# Patient Record
Sex: Female | Born: 1990 | Race: White | Hispanic: No | Marital: Single | Smoking: Never smoker
Health system: Southern US, Community
[De-identification: ages and names within clinical notes are randomized; demographics above are authoritative.]

## PROBLEM LIST (undated history)

## (undated) DIAGNOSIS — F419 Anxiety disorder, unspecified: Secondary | ICD-10-CM

## (undated) DIAGNOSIS — T50912A Poisoning by multiple unspecified drugs, medicaments and biological substances, intentional self-harm, initial encounter: Secondary | ICD-10-CM

## (undated) DIAGNOSIS — F32A Depression, unspecified: Secondary | ICD-10-CM

## (undated) DIAGNOSIS — F329 Major depressive disorder, single episode, unspecified: Secondary | ICD-10-CM

## (undated) DIAGNOSIS — F319 Bipolar disorder, unspecified: Secondary | ICD-10-CM

## (undated) HISTORY — DX: Depression, unspecified: F32.A

## (undated) HISTORY — DX: Anxiety disorder, unspecified: F41.9

## (undated) HISTORY — PX: NO PAST SURGERIES: SHX2092

## (undated) HISTORY — DX: Poisoning by multiple unspecified drugs, medicaments and biological substances, intentional self-harm, initial encounter: T50.912A

## (undated) HISTORY — DX: Major depressive disorder, single episode, unspecified: F32.9

---

## 2012-06-11 ENCOUNTER — Emergency Department (HOSPITAL_COMMUNITY): Payer: BC Managed Care – PPO

## 2012-06-11 ENCOUNTER — Emergency Department (HOSPITAL_COMMUNITY)
Admission: EM | Admit: 2012-06-11 | Discharge: 2012-06-11 | Disposition: A | Discharge status: Home / Self Care | Payer: BC Managed Care – PPO | Attending: Emergency Medicine | Admitting: Emergency Medicine

## 2012-06-11 ENCOUNTER — Encounter (HOSPITAL_COMMUNITY): Payer: Self-pay | Admitting: *Deleted

## 2012-06-11 DIAGNOSIS — R21 Rash and other nonspecific skin eruption: Secondary | ICD-10-CM | POA: Insufficient documentation

## 2012-06-11 DIAGNOSIS — L02419 Cutaneous abscess of limb, unspecified: Secondary | ICD-10-CM | POA: Insufficient documentation

## 2012-06-11 DIAGNOSIS — Z8659 Personal history of other mental and behavioral disorders: Secondary | ICD-10-CM | POA: Insufficient documentation

## 2012-06-11 DIAGNOSIS — W57XXXA Bitten or stung by nonvenomous insect and other nonvenomous arthropods, initial encounter: Secondary | ICD-10-CM

## 2012-06-11 DIAGNOSIS — Y9289 Other specified places as the place of occurrence of the external cause: Secondary | ICD-10-CM | POA: Insufficient documentation

## 2012-06-11 DIAGNOSIS — L03116 Cellulitis of left lower limb: Secondary | ICD-10-CM

## 2012-06-11 DIAGNOSIS — R109 Unspecified abdominal pain: Secondary | ICD-10-CM | POA: Insufficient documentation

## 2012-06-11 DIAGNOSIS — M545 Low back pain, unspecified: Secondary | ICD-10-CM | POA: Insufficient documentation

## 2012-06-11 DIAGNOSIS — L03119 Cellulitis of unspecified part of limb: Secondary | ICD-10-CM | POA: Insufficient documentation

## 2012-06-11 DIAGNOSIS — W208XXA Other cause of strike by thrown, projected or falling object, initial encounter: Secondary | ICD-10-CM | POA: Insufficient documentation

## 2012-06-11 DIAGNOSIS — Y990 Civilian activity done for income or pay: Secondary | ICD-10-CM | POA: Insufficient documentation

## 2012-06-11 HISTORY — DX: Bipolar disorder, unspecified: F31.9

## 2012-06-11 LAB — URINALYSIS, ROUTINE W REFLEX MICROSCOPIC
Hgb urine dipstick: NEGATIVE
Protein, ur: NEGATIVE mg/dL
Urobilinogen, UA: 0.2 mg/dL (ref 0.0–1.0)

## 2012-06-11 LAB — PREGNANCY, URINE: Preg Test, Ur: NEGATIVE

## 2012-06-11 MED ORDER — HYDROCORTISONE 1 % EX CREA: TOPICAL_CREAM | CUTANEOUS | Status: DC

## 2012-06-11 MED ORDER — CEPHALEXIN 500 MG PO CAPS: 500.0000 mg | ORAL_CAPSULE | Freq: Four times a day (QID) | ORAL | Status: DC

## 2012-06-11 MED ORDER — IBUPROFEN 600 MG PO TABS: 600.0000 mg | ORAL_TABLET | Freq: Four times a day (QID) | ORAL | Status: DC | PRN

## 2012-06-11 NOTE — ED Notes (Signed)
Pt from home with reports of left ankle pain and swelling after dropping a cutting board on ankle while at work a month ago. Pt also reports red, "hard", painful areas to both legs that she noticed 2-3 days ago. Pt endorses hx of same with stress but reports that they went away on their own.

## 2012-06-11 NOTE — ED Provider Notes (Signed)
History     CSN: [id number]  Arrival date & time 06/11/12  1191   First MD Initiated Contact with Patient 06/11/12 1201      Chief Complaint  Patient presents with  . Ankle Pain    left  . Joint Swelling    left  . Rash    both legs    (Consider location/radiation/quality/duration/timing/severity/associated sxs/prior treatment) HPI Jamey Demchak is a 22 y.o. female who presents to ED with multiple complaints. Pt states she dropped a cutting board on her left ankle a month ago. States got a "goose egg" in it but now ankle is still swollen, painful to walk on, skin tender, there is a red area on it. Pt also reports big red bumps all over her body, onset few days ago. States they are tender, not itchy. Has not tried any treatment. Also states her right flank pain, states pain on and off for about a month. Nothing makes it better or worse. She lifts heavy things at work. No urinary symptoms. No n/v/d. No abdominal pain. No respiratory symptoms.    Past Medical History  Diagnosis Date  . Bipolar 1 disorder     History reviewed. No pertinent past surgical history.  History reviewed. No pertinent family history.  History  Substance Use Topics  . Smoking status: Never Smoker   . Smokeless tobacco: Never Used  . Alcohol Use: Yes     Comment: occ    OB History    Grav Para Term Preterm Abortions TAB SAB Ect Mult Living                  Review of Systems  Constitutional: Negative for fever and chills.  Respiratory: Negative.   Cardiovascular: Negative.   Genitourinary: Positive for flank pain.  Musculoskeletal: Positive for joint swelling and arthralgias.  Skin: Positive for color change, rash and wound.  All other systems reviewed and are negative.    Allergies  Other  Home Medications   Current Outpatient Rx  Name  Route  Sig  Dispense  Refill  . CEPHALEXIN 500 MG PO CAPS   Oral   Take 1 capsule (500 mg total) by mouth 4 (four) times daily.   28 capsule   0   . HYDROCORTISONE 1 % EX CREA      Apply to affected area 2 times daily   15 g   0   . IBUPROFEN 600 MG PO TABS   Oral   Take 1 tablet (600 mg total) by mouth every 6 (six) hours as needed for pain.   30 tablet   0     BP 110/77  LMP 01/10/2012  Physical Exam  Nursing note and vitals reviewed. Constitutional: She appears well-developed and well-nourished. No distress.  Eyes: Conjunctivae normal are normal.  Neck: Neck supple.  Cardiovascular: Normal rate, regular rhythm and normal heart sounds.   Pulmonary/Chest: Effort normal and breath sounds normal. No respiratory distress. She has no wheezes. She has no rales.  Abdominal: Soft. Bowel sounds are normal. There is no tenderness.       No CVA tenderness  Musculoskeletal:       Left lower anterior shin and ankle swelling. Non pitting. There is an erythematous tender area about 4x2 cm, tender to palpation, where pt states "goose egg" was. No drainage. Area is indurated. No fluctuance. Full rom of the ankle. Normal foot. Normal knee. No calf tenderness. Lumbar spine non tender. Tender in the right lower paravertebral area.  No pain with bilateral straight leg raise  Neurological: She is alert.  Skin: Skin is warm and dry.       There are multiple erythematous raised papules over bilateral LE, thighs. Areas are indurated with central scab. tender  Psychiatric: She has a normal mood and affect. Her behavior is normal.    ED Course  Procedures (including critical care time)   Labs Reviewed  URINALYSIS, ROUTINE W REFLEX MICROSCOPIC  PREGNANCY, URINE   Dg Ankle Complete Left  06/11/2012  *RADIOLOGY REPORT*  Clinical Data: Pain and swelling  LEFT ANKLE COMPLETE - 3+ VIEW  Comparison: None.  Findings: Frontal, oblique, and lateral views were obtained.  There is marked swelling medially.  No fracture or effusion.  Ankle mortise appears intact.  IMPRESSION: Soft tissue swelling medially.  No fracture.  Mortise intact.   Original  Report Authenticated By: Bretta Bang, M.D.      1. Cellulitis of left ankle   2. Bug bites   3. Right low back pain       MDM  Pt with multiple complaints.  1. Left ankle pain/swelling: x-ray negative. Given erythema and tenderness, possible cellulitis vs hematoma infection to the leg. ROM of the joints normal, doubt septic joint. No calf tenderness, doubt DVT. WIll start on keflex. Instructed to get rechecked in 2-3 days.   2. Rash: most consistent with insect bites, possible bed bugs. Pt states rash started 2-3 days ago, mattress bought right prior to that. Instructed to wash all clothes, change mattress, hydrocortisone cream  3. Flank pain. UA negative. Pt does not appear to be in pain, doubt kidney stone. No URi symptoms, no tachycardia, no tachypnea, doubt PE or pneumonia. Suspect musculoskeletal. Pt does lift heavy objects at work.   Pt stable for d/chome with outpatient follow up.        Lottie Mussel, PA 06/11/12 1704

## 2012-06-12 NOTE — ED Provider Notes (Signed)
Medical screening examination/treatment/procedure(s) were performed by non-physician practitioner and as supervising physician I was immediately available for consultation/collaboration.  Juliet Rude. Rubin Payor, MD 06/12/12 [phone]

## 2014-03-09 ENCOUNTER — Emergency Department (HOSPITAL_COMMUNITY): Payer: BC Managed Care – PPO

## 2014-03-09 ENCOUNTER — Emergency Department (HOSPITAL_COMMUNITY)
Admission: EM | Admit: 2014-03-09 | Discharge: 2014-03-10 | Disposition: A | Discharge status: Home / Self Care | Payer: BC Managed Care – PPO | Attending: Emergency Medicine | Admitting: Emergency Medicine

## 2014-03-09 ENCOUNTER — Encounter (HOSPITAL_COMMUNITY): Payer: Self-pay | Admitting: Emergency Medicine

## 2014-03-09 DIAGNOSIS — Y9389 Activity, other specified: Secondary | ICD-10-CM | POA: Insufficient documentation

## 2014-03-09 DIAGNOSIS — F1210 Cannabis abuse, uncomplicated: Secondary | ICD-10-CM | POA: Diagnosis not present

## 2014-03-09 DIAGNOSIS — S0990XA Unspecified injury of head, initial encounter: Secondary | ICD-10-CM | POA: Diagnosis not present

## 2014-03-09 DIAGNOSIS — F410 Panic disorder [episodic paroxysmal anxiety] without agoraphobia: Secondary | ICD-10-CM | POA: Insufficient documentation

## 2014-03-09 DIAGNOSIS — Z3202 Encounter for pregnancy test, result negative: Secondary | ICD-10-CM | POA: Diagnosis not present

## 2014-03-09 DIAGNOSIS — Y92410 Unspecified street and highway as the place of occurrence of the external cause: Secondary | ICD-10-CM | POA: Diagnosis not present

## 2014-03-09 LAB — URINALYSIS, ROUTINE W REFLEX MICROSCOPIC
BILIRUBIN URINE: NEGATIVE
Glucose, UA: NEGATIVE mg/dL
HGB URINE DIPSTICK: NEGATIVE
KETONES UR: NEGATIVE mg/dL
Leukocytes, UA: NEGATIVE
Nitrite: NEGATIVE
Protein, ur: NEGATIVE mg/dL
SPECIFIC GRAVITY, URINE: 1.009 (ref 1.005–1.030)
UROBILINOGEN UA: 0.2 mg/dL (ref 0.0–1.0)
pH: 7 (ref 5.0–8.0)

## 2014-03-09 LAB — POC URINE PREG, ED: PREG TEST UR: NEGATIVE

## 2014-03-09 LAB — RAPID URINE DRUG SCREEN, HOSP PERFORMED
Amphetamines: NOT DETECTED
BARBITURATES: NOT DETECTED
Benzodiazepines: NOT DETECTED
COCAINE: NOT DETECTED
Opiates: NOT DETECTED
TETRAHYDROCANNABINOL: POSITIVE — AB

## 2014-03-09 LAB — COMPREHENSIVE METABOLIC PANEL WITH GFR
ALT: 9 U/L (ref 0–35)
AST: 15 U/L (ref 0–37)
Albumin: 4.5 g/dL (ref 3.5–5.2)
Alkaline Phosphatase: 79 U/L (ref 39–117)
Anion gap: 17 — ABNORMAL HIGH (ref 5–15)
BUN: 12 mg/dL (ref 6–23)
CO2: 19 meq/L (ref 19–32)
Calcium: 9.5 mg/dL (ref 8.4–10.5)
Chloride: 105 meq/L (ref 96–112)
Creatinine, Ser: 0.6 mg/dL (ref 0.50–1.10)
GFR calc Af Amer: >90 mL/min (ref >90)
GFR calc non Af Amer: >90 mL/min (ref >90)
Glucose, Bld: 87 mg/dL (ref 70–99)
Potassium: 3.6 meq/L — ABNORMAL LOW (ref 3.7–5.3)
Sodium: 141 meq/L (ref 137–147)
Total Bilirubin: 0.4 mg/dL (ref 0.3–1.2)
Total Protein: 7.7 g/dL (ref 6.0–8.3)

## 2014-03-09 LAB — CBC WITH DIFFERENTIAL/PLATELET
Basophils Absolute: 0 K/uL (ref 0.0–0.1)
Basophils Relative: 0 % (ref 0–1)
Eosinophils Absolute: 0.1 K/uL (ref 0.0–0.7)
Eosinophils Relative: 1 % (ref 0–5)
HCT: 38.7 % (ref 36.0–46.0)
Hemoglobin: 13.8 g/dL (ref 12.0–15.0)
Lymphocytes Relative: 15 % (ref 12–46)
Lymphs Abs: 1.3 K/uL (ref 0.7–4.0)
MCH: 31.7 pg (ref 26.0–34.0)
MCHC: 35.7 g/dL (ref 30.0–36.0)
MCV: 89 fL (ref 78.0–100.0)
Monocytes Absolute: 0.6 K/uL (ref 0.1–1.0)
Monocytes Relative: 7 % (ref 3–12)
Neutro Abs: 6.4 K/uL (ref 1.7–7.7)
Neutrophils Relative %: 77 % (ref 43–77)
Platelets: 229 K/uL (ref 150–400)
RBC: 4.35 MIL/uL (ref 3.87–5.11)
RDW: 11.5 % (ref 11.5–15.5)
WBC: 8.3 K/uL (ref 4.0–10.5)

## 2014-03-09 MED ORDER — SODIUM CHLORIDE 0.9 % IV SOLN
Freq: Once | INTRAVENOUS | Status: AC
  Administered 2014-03-09: 21:00:00 via INTRAVENOUS

## 2014-03-09 MED ORDER — LORAZEPAM 2 MG/ML IJ SOLN
1.0000 mg | Freq: Once | INTRAMUSCULAR | Status: AC
  Administered 2014-03-09: 1 mg via INTRAVENOUS
  Filled 2014-03-09: qty 1

## 2014-03-09 NOTE — ED Notes (Signed)
Unable to capture ekg, pt unable to hold still due to hyperventilating.
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
## 2014-03-10 ENCOUNTER — Telehealth (HOSPITAL_BASED_OUTPATIENT_CLINIC_OR_DEPARTMENT_OTHER): Payer: Self-pay | Admitting: Emergency Medicine

## 2014-03-10 MED ORDER — ALPRAZOLAM 0.5 MG PO TABS: 0.5000 mg | ORAL_TABLET | Freq: Three times a day (TID) | ORAL | Status: DC | PRN
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
## 2014-03-10 NOTE — Discharge Instructions (Signed)
Head Injury You have a head injury. Headaches and throwing up (vomiting) are common after a head injury. It should be easy to wake up from sleeping. Sometimes you must stay in the hospital. Most problems happen within the first 24 hours. Side effects may occur up to 7-10 days after the injury.  WHAT ARE THE TYPES OF HEAD INJURIES? Head injuries can be as minor as a bump. Some head injuries can be more severe. More severe head injuries include:  A jarring injury to the brain (concussion).  A bruise of the brain (contusion). This mean there is bleeding in the brain that can cause swelling.  A cracked skull (skull fracture).  Bleeding in the brain that collects, clots, and forms a bump (hematoma). WHEN SHOULD I GET HELP RIGHT AWAY?   You are confused or sleepy.  You cannot be woken up.  You feel sick to your stomach (nauseous) or keep throwing up (vomiting).  Your dizziness or unsteadiness is getting worse.  You have very bad, lasting headaches that are not helped by medicine. Take medicines only as told by your doctor.  You cannot use your arms or legs like normal.  You cannot walk.  You notice changes in the black spots in the center of the colored part of your eye (pupil).  You have clear or bloody fluid coming from your nose or ears.  You have trouble seeing. During the next 24 hours after the injury, you must stay with someone who can watch you. This person should get help right away (call 911 in the U.[place] if you start to shake and are not able to control it (have seizures), you pass out, or you are unable to wake up. HOW CAN I PREVENT A HEAD INJURY IN THE FUTURE?  Wear seat belts.  Wear a helmet while bike riding and playing sports like football.  Stay away from dangerous activities around the house. WHEN CAN I RETURN TO NORMAL ACTIVITIES AND ATHLETICS? See your doctor before doing these activities. You should not do normal activities or play contact sports until 1 week  after the following symptoms have stopped:  Headache that does not go away.  Dizziness.  Poor attention.  Confusion.  Memory problems.  Sickness to your stomach or throwing up.  Tiredness.  Fussiness.  Bothered by bright lights or loud noises.  Anxiousness or depression.  Restless sleep. MAKE SURE YOU:   Understand these instructions.  Will watch your condition.  Will get help right away if you are not doing well or get worse. Document Released: 04/27/2008 Document Revised: 09/29/2013 Document Reviewed: 01/20/2013 [facility] Patient Information 2015 [place], [place]. This information is not intended to replace advice given to you by your health care provider. Make sure you discuss any questions you have with your health care provider.  Panic Attacks Panic attacks are sudden, short-livedsurges of severe anxiety, fear, or discomfort. They may occur for no reason when you are relaxed, when you are anxious, or when you are sleeping. Panic attacks may occur for a number of reasons:   Healthy people occasionally have panic attacks in extreme, life-threatening situations, such as war or natural disasters. Normal anxiety is a protective mechanism of the body that helps [place] react to danger (fight or flight response).  Panic attacks are often seen with anxiety disorders, such as panic disorder, social anxiety disorder, generalized anxiety disorder, and phobias. Anxiety disorders cause excessive or uncontrollable anxiety. They may interfere with your relationships or other life activities.  Panic  attacks are sometimes seen with other mental illnesses, such as depression and posttraumatic stress disorder.  Certain medical conditions, prescription medicines, and drugs of abuse can cause panic attacks. SYMPTOMS  Panic attacks start suddenly, peak within 20 minutes, and are accompanied by four or more of the following symptoms:  Pounding heart or fast heart rate  (palpitations).  Sweating.  Trembling or shaking.  Shortness of breath or feeling smothered.  Feeling choked.  Chest pain or discomfort.  Nausea or strange feeling in your stomach.  Dizziness, light-headedness, or feeling like you will faint.  Chills or hot flushes.  Numbness or tingling in your lips or hands and feet.  Feeling that things are not real or feeling that you are not yourself.  Fear of losing control or going crazy.  Fear of dying. Some of these symptoms can mimic serious medical conditions. For example, you may think you are having a heart attack. Although panic attacks can be very scary, they are not life threatening. DIAGNOSIS  Panic attacks are diagnosed through an assessment by your health care provider. Your health care provider will ask questions about your symptoms, such as where and when they occurred. Your health care provider will also ask about your medical history and use of alcohol and drugs, including prescription medicines. Your health care provider may order blood tests or other studies to rule out a serious medical condition. Your health care provider may refer you to a mental health professional for further evaluation. TREATMENT   Most healthy people who have one or two panic attacks in an extreme, life-threatening situation will not require treatment.  The treatment for panic attacks associated with anxiety disorders or other mental illness typically involves counseling with a mental health professional, medicine, or a combination of both. Your health care provider will help determine what treatment is best for you.  Panic attacks due to physical illness usually go away with treatment of the illness. If prescription medicine is causing panic attacks, talk with your health care provider about stopping the medicine, decreasing the dose, or substituting another medicine.  Panic attacks due to alcohol or drug abuse go away with abstinence. Some adults  need professional help in order to stop drinking or using drugs. HOME CARE INSTRUCTIONS   Take all medicines as directed by your health care provider.   Schedule and attend follow-up visits as directed by your health care provider. It is important to keep all your appointments. SEEK MEDICAL CARE IF:  You are not able to take your medicines as prescribed.  Your symptoms do not improve or get worse. SEEK IMMEDIATE MEDICAL CARE IF:   You experience panic attack symptoms that are different than your usual symptoms.  You have serious thoughts about hurting yourself or others.  You are taking medicine for panic attacks and have a serious side effect. MAKE SURE YOU:  Understand these instructions.  Will watch your condition.  Will get help right away if you are not doing well or get worse. Document Released: 05/15/2005 Document Revised: 05/20/2013 Document Reviewed: 12/27/2012 [facility] Patient Information 2015 [place], [place]. This information is not intended to replace advice given to you by your health care provider. Make sure you discuss any questions you have with your health care provider.   Emergency Department Resource Guide 1) Find a Doctor and Pay Out of Pocket Although you won't have to find out who is covered by your insurance plan, it is a good idea to ask around and get recommendations. You  will then need to call the office and see if the doctor you have chosen will accept you as a new patient and what types of options they offer for patients who are self-pay. Some doctors offer discounts or will set up payment plans for their patients who do not have insurance, but you will need to ask so you aren't surprised when you get to your appointment.  2) Contact Your Local Health Department Not all health departments have doctors that can see patients for sick visits, but many do, so it is worth a call to see if yours does. If you don't know where your local health department is,  you can check in your phone book. The CDC also has a tool to help you locate your state's health department, and many state websites also have listings of all of their local health departments.  3) Find a Walk-in Clinic If your illness is not likely to be very severe or complicated, you may want to try a walk in clinic. These are popping up all over the country in pharmacies, drugstores, and shopping centers. They're usually staffed by nurse practitioners or physician assistants that have been trained to treat common illnesses and complaints. They're usually fairly quick and inexpensive. However, if you have serious medical issues or chronic medical problems, these are probably not your best option.  No Primary Care Doctor: - Call Health Connect at  [phone] - they can help you locate a primary care doctor that  accepts your insurance, provides certain services, etc. - Physician Referral Service- [phone]  Chronic Pain Problems: Organization         Address  Phone   Notes  [facility]  [phone] Patients need to be referred by their primary care doctor.   Medication Assistance: Organization         Address  Phone   Notes  [facility] [address] [place], [place] [address] [phone] --Must be a resident of [facility] -- Must have NO insurance coverage whatsoever (no Medicaid/ Medicare, etc.) -- The pt. MUST have a primary care doctor that directs their care regularly and follows them in the community   [facility]  [phone]   [employer]  [phone]    Agencies that provide inexpensive medical care: Organization         Address  Phone   Notes  [facility]  [phone]   [facility]    [phone]   [facility] [address] [place], [place] [address] [phone]   [facility] [address] [place], [place] [phone]   Planned Parenthood    [phone]   [facility]    [phone]   [facility]  [address], [place] Phone:  [phone], Fax:  [phone] Hours of Operation:  9 am - 6 pm, M-F.  Also accepts Medicaid/Medicare and self-pay.  [facility]  [address], [place] Phone: [phone], Fax: [phone]. Hours of Operation:  8:30 am - 5:30 pm, M-F.  Also accepts Medicaid and self-pay.  [facility] [place] [address], [place] Phone: [phone]   [facility] [address] Natasha Bence [place], [place] [phone], Ext. [phone] Mondays & Thursdays: 7-9 AM.  First 15 patients are seen on a first come, first  serve basis.    Medicaid-accepting [facility] Providers:  Organization         Address  Phone   Notes  [facility] [address], [place] [phone] Also accepts self-pay patients.  [facility] [address] Laurell Josephs [place], [place]  [phone]   [facility] [address], [place] [phone]   [facility] [address] [place] [phone]   Renaye Rakers [address], [place]   [phone] Only accepts [place] Access [place] patients after they have their name applied to their card.   Self-Pay (no insurance) in [facility]:  Organization         Address  Phone   Notes  Sickle Cell Patients, [facility] [address] [place], [place] [phone]   [facility] [address] [place], [place] [phone]   [facility] [place]  [address], [place] [phone]   [facility]/Dr. Osei-Bonsu  [address] [employer] or [address], [place] [phone] Phone number for both [place] and [place] locations  is the same.  [facility] [address], [place] [phone]   [facility] [address], [place] or [address] [phone]   [facility] [address] [place] [phone], phone; [phone], fax Sees patients 1st and 3rd Saturday of every month.  Must not qualify for public or private insurance (i.e. Medicaid, Medicare, [place] Health Choice, Veterans' Benefits)  Household income should be no more than 200% of the poverty level The clinic cannot treat you if you are pregnant or think you are pregnant  Sexually transmitted diseases are not treated at the clinic.    Dental Care: Organization         Address  Phone  Notes  [facility] Department of [facility] [address] [place], [place] [phone] Accepts children up to age 40 who are enrolled in [place] or [employer] Health Choice; pregnant women with a Medicaid card; and children who have applied for Medicaid or [place] Health Choice, but were declined, whose parents can pay a reduced fee at time of service.  [facility] Department of [facility]  [address], [place] [phone] Accepts children up to age 51 who are enrolled in [place] or [place] Health Choice; pregnant women with a Medicaid card; and children who have applied for Medicaid or [place] Health Choice, but were declined, whose parents can pay a reduced fee at time of service.  [facility]  [address] [place], [place] [phone] Patients are seen by appointment only. Walk-ins are not accepted. [facility] will see patients 96 years of age and older. Monday - Tuesday (8am-5pm) Most Wednesdays (8:30-5pm) $30 per visit, cash only  [facility]  [address], [facility] [phone] Patients are seen by appointment only. Walk-ins are not accepted. [facility] will see  patients 41 years of age and older. One Wednesday Evening (Monthly: Volunteer Based).  $30 per visit, cash only  [employer]  [phone] for adults; Children under age 2, call Graduate Pediatric Dentistry at [phone]. Children aged 56-14, please call (  [phone]) [phone] to request a pediatric application.  Dental services are provided in all areas of dental care including fillings, crowns and bridges, complete and partial dentures, implants, gum treatment, root canals, and extractions. Preventive care is also provided. Treatment is provided to both adults and children. Patients are selected via a lottery and there is often a waiting list.   [facility] [address], [place]  [phone] [email or web address]   [facility] [address], [place], [place] [phone], Ext. [phone] Second and Fourth Thursday of each month, opens at 6:30 AM; Clinic ends at 9 AM.  Patients are seen on a first-come first-served basis, and a limited number are seen during each clinic.   [facility]  [address], [place], [place] [phone]   Eligibility Requirements You must have lived in [place], [place], or [place] counties for at least the last three months.   You cannot be eligible for state or federal sponsored [place]healthcare insurance, including CIGNAVeterans Administration, [place]icaid, or [employer]icare.   You generally cannot be eligible for healthcare insurance through your employer.    How to apply: Eligibility screenings are held every Tuesday and Wednesday afternoon from 1:00 pm until 4:00 pm. You do not need an appointment for the interview!  [facility] [address], [place], [place] [phone]   [facility]  [phone]   [facility]  [phone]   [facility]  [phone]    Behavioral Health Resources in the Community: Intensive Outpatient  Programs Organization         Address  Phone  Notes  [facility] [address], [place], [place] [phone]   [facility] [address], [place], [place] [phone]   [facility] [address], [place], [place]  [phone]   [facility] [address],  [place], [place] [phone] or [phone]   Substance Abuse Resources Organization         Address  Phone  Notes  [facility]  [phone]   [facility]  [phone]   [facility]  [phone]   [facility]  [phone]   Residential & Outpatient Substance Abuse Program  [phone]   Psychological Services Organization         Address  Phone  Notes  [facility]  [phone]- [phone]   [facility]  [phone]- [phone]   [facility] [address], [place] [phone] or [phone]    Mobile Crisis Teams Organization         Address  Phone  Notes  [facility], Mobile Crisis Care Unit  [phone]   [facility]  [address] [place], [place] [phone]   Doristine LocksSharon DeEsch [address] [place] [phone]    Self-Help/Support Groups Organization         Address  Phone             Notes  [facility] [place] - variety of support groups  336- [phone] Call for more information  Narcotics Anonymous (NA), [facility] [address], [place]  2 meetings at this location   Statisticianesidential Treatment Programs Organization         Address  Phone  Notes  [facility] [address],    [place]  [phone]   [facility]  [phone], [place], [place] [phone]   [facility] [address], [place] [phone] Admissions: 8am-3pm M-F  [facility] [address]    [place], [place]  [phone]   [facility] [address], [place], [place] [phone]   [facility] [address]  [place], [place] [phone]   [facility] - Intensive Outpatient [address], [place], [place] [phone]   [facility] ([facility] [address]  [place], [place] [phone] or [phone]   [facility] ([facility]) [address] [place], [place] [phone] Accepts Medicaid  [facility] [address]  [place] [phone] Substance Abuse/Addiction Treatment   [facility] Resources Organization         Address  Phone  Notes  [facility]  [phone]   Angie FavaJulie Brannon, PhD [address], [address] [place], [place]   [phone] or [phone]   [facility]   [address] [place], [place] [phone]   [facility] [address], [place], [place] [phone]) [phone] Insurance/Medicaid/sponsorship through [facility]  [facility] Families [address]                                    [place], [place] [phone]) [phone] Therapy/tele-psych/case  [facility] [address]   [place], [place] [phone]    Dr. Lolly MustacheArfeen  [phone]) [phone]   [facility]  [employer] [facility] 1) [address], [place] 2) [address], [place] 3)  [address], [place] [phone]) [phone]  [phone]   [facility] Child Abuse Hotline [phone] or [phone] (After Hours)

## 2014-03-12 ENCOUNTER — Inpatient Hospital Stay (HOSPITAL_COMMUNITY)
Admission: AD | Admit: 2014-03-12 | Discharge: 2014-03-16 | DRG: 885 | Disposition: A | Discharge status: Home / Self Care | Payer: BC Managed Care – PPO | Source: Intra-hospital | Attending: Psychiatry | Admitting: Psychiatry

## 2014-03-12 ENCOUNTER — Encounter (HOSPITAL_COMMUNITY): Payer: Self-pay

## 2014-03-12 ENCOUNTER — Emergency Department (HOSPITAL_COMMUNITY)
Admission: EM | Admit: 2014-03-12 | Discharge: 2014-03-12 | Disposition: A | Discharge status: Other Acute Inpatient Hospital | Payer: BC Managed Care – PPO | Attending: Emergency Medicine | Admitting: Emergency Medicine

## 2014-03-12 ENCOUNTER — Encounter (HOSPITAL_COMMUNITY): Payer: Self-pay | Admitting: Emergency Medicine

## 2014-03-12 DIAGNOSIS — T1491XA Suicide attempt, initial encounter: Secondary | ICD-10-CM

## 2014-03-12 DIAGNOSIS — G4700 Insomnia, unspecified: Secondary | ICD-10-CM | POA: Diagnosis present

## 2014-03-12 DIAGNOSIS — Z3202 Encounter for pregnancy test, result negative: Secondary | ICD-10-CM | POA: Diagnosis not present

## 2014-03-12 DIAGNOSIS — T50902A Poisoning by unspecified drugs, medicaments and biological substances, intentional self-harm, initial encounter: Secondary | ICD-10-CM

## 2014-03-12 DIAGNOSIS — F322 Major depressive disorder, single episode, severe without psychotic features: Secondary | ICD-10-CM

## 2014-03-12 DIAGNOSIS — F32A Depression, unspecified: Secondary | ICD-10-CM | POA: Diagnosis present

## 2014-03-12 DIAGNOSIS — F332 Major depressive disorder, recurrent severe without psychotic features: Principal | ICD-10-CM | POA: Diagnosis present

## 2014-03-12 DIAGNOSIS — Y929 Unspecified place or not applicable: Secondary | ICD-10-CM | POA: Insufficient documentation

## 2014-03-12 DIAGNOSIS — T424X2A Poisoning by benzodiazepines, intentional self-harm, initial encounter: Secondary | ICD-10-CM | POA: Insufficient documentation

## 2014-03-12 DIAGNOSIS — Z609 Problem related to social environment, unspecified: Secondary | ICD-10-CM | POA: Diagnosis present

## 2014-03-12 DIAGNOSIS — F42 Obsessive-compulsive disorder: Secondary | ICD-10-CM | POA: Diagnosis present

## 2014-03-12 DIAGNOSIS — T450X2A Poisoning by antiallergic and antiemetic drugs, intentional self-harm, initial encounter: Secondary | ICD-10-CM | POA: Insufficient documentation

## 2014-03-12 DIAGNOSIS — Z79899 Other long term (current) drug therapy: Secondary | ICD-10-CM | POA: Diagnosis not present

## 2014-03-12 DIAGNOSIS — F410 Panic disorder [episodic paroxysmal anxiety] without agoraphobia: Secondary | ICD-10-CM | POA: Diagnosis present

## 2014-03-12 DIAGNOSIS — F411 Generalized anxiety disorder: Secondary | ICD-10-CM | POA: Diagnosis present

## 2014-03-12 DIAGNOSIS — F3130 Bipolar disorder, current episode depressed, mild or moderate severity, unspecified: Secondary | ICD-10-CM

## 2014-03-12 DIAGNOSIS — F1290 Cannabis use, unspecified, uncomplicated: Secondary | ICD-10-CM | POA: Diagnosis present

## 2014-03-12 DIAGNOSIS — Y939 Activity, unspecified: Secondary | ICD-10-CM | POA: Diagnosis not present

## 2014-03-12 DIAGNOSIS — Z599 Problem related to housing and economic circumstances, unspecified: Secondary | ICD-10-CM

## 2014-03-12 DIAGNOSIS — F4001 Agoraphobia with panic disorder: Secondary | ICD-10-CM

## 2014-03-12 DIAGNOSIS — Z8659 Personal history of other mental and behavioral disorders: Secondary | ICD-10-CM | POA: Diagnosis not present

## 2014-03-12 DIAGNOSIS — F329 Major depressive disorder, single episode, unspecified: Secondary | ICD-10-CM | POA: Diagnosis present

## 2014-03-12 DIAGNOSIS — R45851 Suicidal ideations: Secondary | ICD-10-CM

## 2014-03-12 LAB — PROTIME-INR
INR: 1.02 (ref 0.00–1.49)
Prothrombin Time: 13.5 seconds (ref 11.6–15.2)

## 2014-03-12 LAB — CBC
HCT: 39.6 % (ref 36.0–46.0)
Hemoglobin: 13.7 g/dL (ref 12.0–15.0)
MCH: 31.3 pg (ref 26.0–34.0)
MCHC: 34.6 g/dL (ref 30.0–36.0)
MCV: 90.4 fL (ref 78.0–100.0)
PLATELETS: 215 10*3/uL (ref 150–400)
RBC: 4.38 MIL/uL (ref 3.87–5.11)
RDW: 11.5 % (ref 11.5–15.5)
WBC: 6.8 10*3/uL (ref 4.0–10.5)

## 2014-03-12 LAB — RAPID URINE DRUG SCREEN, HOSP PERFORMED
Amphetamines: NOT DETECTED
BENZODIAZEPINES: POSITIVE — AB
Barbiturates: NOT DETECTED
COCAINE: NOT DETECTED
Opiates: NOT DETECTED
Tetrahydrocannabinol: POSITIVE — AB

## 2014-03-12 LAB — COMPREHENSIVE METABOLIC PANEL
ALBUMIN: 4.6 g/dL (ref 3.5–5.2)
ALT: 9 U/L (ref 0–35)
AST: 12 U/L (ref 0–37)
Alkaline Phosphatase: 79 U/L (ref 39–117)
Anion gap: 16 — ABNORMAL HIGH (ref 5–15)
BUN: 13 mg/dL (ref 6–23)
CALCIUM: 9.7 mg/dL (ref 8.4–10.5)
CHLORIDE: 102 meq/L (ref 96–112)
CO2: 23 mEq/L (ref 19–32)
CREATININE: 0.54 mg/dL (ref 0.50–1.10)
GFR calc Af Amer: >90 mL/min (ref >90)
GFR calc non Af Amer: >90 mL/min (ref >90)
Glucose, Bld: 96 mg/dL (ref 70–99)
Potassium: 3.8 mEq/L (ref 3.7–5.3)
SODIUM: 141 meq/L (ref 137–147)
Total Bilirubin: 0.3 mg/dL (ref 0.3–1.2)
Total Protein: 7.8 g/dL (ref 6.0–8.3)

## 2014-03-12 LAB — SALICYLATE LEVEL

## 2014-03-12 LAB — ETHANOL

## 2014-03-12 LAB — ACETAMINOPHEN LEVEL: Acetaminophen (Tylenol), Serum: <15 ug/mL (ref 10–30)

## 2014-03-12 LAB — PREGNANCY, URINE: Preg Test, Ur: NEGATIVE

## 2014-03-12 MED ORDER — ONDANSETRON HCL 4 MG PO TABS: 4.0000 mg | ORAL_TABLET | Freq: Three times a day (TID) | ORAL | Status: DC | PRN

## 2014-03-12 MED ORDER — HYDROXYZINE HCL 25 MG PO TABS
25.0000 mg | ORAL_TABLET | Freq: Three times a day (TID) | ORAL | Status: DC | PRN
  Administered 2014-03-12: 25 mg via ORAL
  Filled 2014-03-12: qty 1

## 2014-03-12 MED ORDER — NICOTINE 21 MG/24HR TD PT24: 21.0000 mg | MEDICATED_PATCH | Freq: Every day | TRANSDERMAL | Status: DC | PRN

## 2014-03-12 MED ORDER — MAGNESIUM HYDROXIDE 400 MG/5ML PO SUSP: 30.0000 mL | Freq: Every day | ORAL | Status: DC | PRN

## 2014-03-12 MED ORDER — ALUM & MAG HYDROXIDE-SIMETH 200-200-20 MG/5ML PO SUSP: 30.0000 mL | ORAL | Status: DC | PRN

## 2014-03-12 MED ORDER — TRAZODONE HCL 50 MG PO TABS: 50.0000 mg | ORAL_TABLET | Freq: Every evening | ORAL | Status: DC | PRN

## 2014-03-12 MED ORDER — ACETAMINOPHEN 325 MG PO TABS: 650.0000 mg | ORAL_TABLET | ORAL | Status: DC | PRN

## 2014-03-12 MED ORDER — HYDROXYZINE HCL 25 MG PO TABS
25.0000 mg | ORAL_TABLET | Freq: Three times a day (TID) | ORAL | Status: DC | PRN
  Administered 2014-03-13 – 2014-03-14 (×2): 25 mg via ORAL
  Filled 2014-03-12 (×3): qty 1
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
## 2014-03-12 NOTE — Tx Team (Signed)
Initial Interdisciplinary Treatment Plan   PATIENT STRESSORS: Financial difficulties Health problems Medication change or noncompliance   PROBLEM LIST: Problem List/Patient Goals Date to be addressed Date deferred Reason deferred Estimated date of resolution  depression 03/12/2014     anxiety 03/12/2014     Suicidal ideation 03/12/2014                                          DISCHARGE CRITERIA:  Ability to meet basic life and health needs Improved stabilization in mood, thinking, and/or behavior Need for constant or close observation no longer present Safe-care adequate arrangements made Verbal commitment to aftercare and medication compliance  PRELIMINARY DISCHARGE PLAN: Attend aftercare/continuing care group Outpatient therapy Return to previous living arrangement Return to previous work or school arrangements  PATIENT/FAMIILY INVOLVEMENT: This treatment plan has been presented to and reviewed with the patient, Egbert GaribaldiCatherine Bangs, and/or family member, The patient and family have been given the opportunity to ask questions and make suggestions.  JEHU-APPIAH, Naoko Diperna K 03/12/2014, 10:35 PM
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
## 2014-03-12 NOTE — ED Notes (Signed)
Pt is vegan. Pt will not accept any animal meat or any animal by products.

## 2014-03-12 NOTE — ED Notes (Signed)
Pt unable to urinate at this time. Will notify staff when able 

## 2014-03-12 NOTE — ED Notes (Signed)
Pt took medication 30 minutes ago
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
## 2014-03-12 NOTE — ED Notes (Signed)
Pt has one piercing, above lip, below nose, that is unable to be removed. All other piercing's have been removed. Jewelry and clothing are in possession of family.

## 2014-03-13 DIAGNOSIS — F42 Obsessive-compulsive disorder: Secondary | ICD-10-CM

## 2014-03-13 LAB — TSH: TSH: 4.34 u[IU]/mL (ref 0.350–4.500)

## 2014-03-13 MED ORDER — TRAZODONE HCL 50 MG PO TABS
50.0000 mg | ORAL_TABLET | Freq: Every evening | ORAL | Status: DC | PRN
  Administered 2014-03-13 – 2014-03-14 (×2): 50 mg via ORAL
  Filled 2014-03-13 (×2): qty 1

## 2014-03-13 MED ORDER — LORAZEPAM 0.5 MG PO TABS
ORAL_TABLET | ORAL | Status: AC
  Filled 2014-03-13: qty 1

## 2014-03-13 MED ORDER — BUSPIRONE HCL 5 MG PO TABS
5.0000 mg | ORAL_TABLET | Freq: Two times a day (BID) | ORAL | Status: DC
  Administered 2014-03-13 – 2014-03-15 (×4): 5 mg via ORAL
  Filled 2014-03-13 (×8): qty 1

## 2014-03-13 MED ORDER — LORAZEPAM 0.5 MG PO TABS
0.5000 mg | ORAL_TABLET | Freq: Four times a day (QID) | ORAL | Status: DC | PRN
  Administered 2014-03-13 – 2014-03-15 (×4): 0.5 mg via ORAL
  Filled 2014-03-13 (×3): qty 1

## 2014-03-13 MED ORDER — OLANZAPINE-FLUOXETINE HCL 6-25 MG PO CAPS
1.0000 | ORAL_CAPSULE | Freq: Every day | ORAL | Status: DC
  Administered 2014-03-13 – 2014-03-15 (×3): 1 via ORAL
  Filled 2014-03-13: qty 4
  Filled 2014-03-13 (×3): qty 1
  Filled 2014-03-13: qty 4
  Filled 2014-03-13 (×2): qty 1
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
## 2014-03-13 NOTE — Progress Notes (Signed)
Pt has been anxious, tearful and worried about "getting the right medications". She rated all her depression, hopelessness and anxiety a 10 on her self-inventory. She did isolated this morning even saying" I don't want to be around some of the other patients I feel scared" we had many 1:1 talks today and she did start interacting in the dayroom and going down for lunch and dinner. She has some passive S/I but no plan here in hospital and does contract to come to staff.  She wrote her goal today "not wanting to die" she went on to say that "she would talk with staff about her fears, depression and anxiety" She was started on some medications today to include tonight (first vistaril was given (1320) but she kept crying off and on ativan 0.5 mg for severe anxiety giving at 1333 buspar. She will start Symbyax tonight.
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
## 2014-03-13 NOTE — [facility] Group Notes (Signed)
[facility] Discharge Planning [facility] Note   03/13/2014 11:41 AM  Participation Quality:  Patient did not attend group- patient in bed.    Victoria Robbins, Victoria Robbins
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
## 2014-03-13 NOTE — [facility] Group Notes (Signed)
[facility] LCSW Group Therapy  03/13/2014 2:47 PM  Type of Therapy:  Group Therapy  Participation Level:  Did Not Attend - Did not attend.  Wynn BankerHodnett, Derreon Consalvo Hairston 03/13/2014, 2:47 PM
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
## 2014-03-13 NOTE — Progress Notes (Signed)
The patient attended the A.A. Meeting this evening. The patient worked on her Charles Schwabcoloring pages throughout the meeting and had nothing to share.

## 2014-03-13 NOTE — Plan of Care (Signed)
Problem: Diagnosis: Increased Risk For Suicide Attempt Goal: STG-Patient Will Report Suicidal Feelings to Staff Outcome: Progressing Pt reported SI and verbally contract to come to staff when feeling unsafe

## 2014-03-14 DIAGNOSIS — F332 Major depressive disorder, recurrent severe without psychotic features: Principal | ICD-10-CM

## 2014-03-14 DIAGNOSIS — F411 Generalized anxiety disorder: Secondary | ICD-10-CM

## 2014-03-14 MED ORDER — PSEUDOEPHEDRINE HCL ER 120 MG PO TB12
120.0000 mg | ORAL_TABLET | Freq: Two times a day (BID) | ORAL | Status: DC | PRN
  Administered 2014-03-14: 120 mg via ORAL
  Filled 2014-03-14: qty 1
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
## 2014-03-14 NOTE — [facility] Group Notes (Signed)
[facility] Group Notes:  (Clinical Social Work)  03/14/2014     10-11AM  Summary of Progress/Problems:   The main focus of today's process group was to learn how to use a decisional balance exercise to move forward in the Stages of Change, which were described and discussed.  Motivational Interviewing and a worksheet were utilized to help patients explore in depth the perceived benefits and costs of a self-sabotaging behavior, as well as the  benefits and costs of replacing that with a healthy coping mechanism.   The patient expressed that she isolates herself, and was able to identify that isolation as a contributing factor in her ongoing depression, subsequent suicide attempt.    Type of Therapy:  Group Therapy - Process   Participation Level:  Active  Participation Quality:  Attentive and Sharing  Affect:  Depressed and Flat  Cognitive:  Alert and Oriented  Insight:  Engaged  Engagement in Therapy:  Engaged  Modes of Intervention:  Education, Motivational Interviewing  Ambrose MantleMareida Grossman-Orr, LCSW 03/14/2014, 12:31 PM

## 2014-03-14 NOTE — Progress Notes (Addendum)
D:  Patient's self inventory sheet, patient has fair sleep, sleep medication is helpful.  P:oor appetite, low energy level, poor concentration.  Rated depression and hopeless #9, anxiety #10.  Denied withdrawals.  Sometimes SI, contracts for safety.  Physical problems today of lightheadedness, blurred vision.  Physical pain of neck/lower head in past 24 hours.  Goal is to decrease anxiety.  No discharge plans.  No problems taking meds after discharge. A:  Medications administered per MD orders.  Emotional support and encouragement given patient. R:  Patient denied HI.   Denied A/V hallucinations.  Safety maintained with 15 minute checks.  Patient very anxious after talking to her parents.  Stated parents told her over phone that they do not trust her, that they want her to come back home to live.  Patient stated she has worked hard to go back to school and wants to return to school.  If she goes home to her parents' house, she will not have a job or a car.  Patient feels she will be isolated from everyone.  Vistaril given patient for anxiety after she talked to her parents.  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  Patient admitted that she does see shadows occasionally but not today.

## 2014-03-14 NOTE — Progress Notes (Signed)
D   Pt is anxious and depressed   She attended group  But did not really participate   She has been visible on the milieu and interacts appropriately with staff and peers    Her mood seems more stable although she did request medications for her anxiety   She reports poor sleep and received medication for that too A   Verbal support and encouragement given   Discussed medications and theraputic use with patient   medications administer  And effectiveness monitored   Q 15 min checks R   Pt safe at present and was receptive to verbal support

## 2014-03-14 NOTE — Progress Notes (Signed)
Patient did attend the evening speaker AA meeting.  

## 2014-03-15 MED ORDER — TRAZODONE HCL 100 MG PO TABS
100.0000 mg | ORAL_TABLET | Freq: Every evening | ORAL | Status: DC | PRN
  Administered 2014-03-15: 100 mg via ORAL
  Filled 2014-03-15: qty 4
  Filled 2014-03-15 (×2): qty 1

## 2014-03-15 MED ORDER — HYDROXYZINE HCL 50 MG PO TABS
50.0000 mg | ORAL_TABLET | Freq: Three times a day (TID) | ORAL | Status: DC | PRN
  Filled 2014-03-15: qty 10

## 2014-03-15 MED ORDER — BUSPIRONE HCL 5 MG PO TABS
ORAL_TABLET | ORAL | Status: AC
  Filled 2014-03-15: qty 1

## 2014-03-15 MED ORDER — BUSPIRONE HCL 5 MG PO TABS
7.5000 mg | ORAL_TABLET | Freq: Two times a day (BID) | ORAL | Status: DC
  Administered 2014-03-15 – 2014-03-16 (×2): 7.5 mg via ORAL
  Filled 2014-03-15: qty 12
  Filled 2014-03-15: qty 1
  Filled 2014-03-15 (×3): qty 12
  Filled 2014-03-15 (×4): qty 1

## 2014-03-15 NOTE — Progress Notes (Signed)
Patient ID: Victoria GaribaldiCatherine Robbins, female   DOB: 10/09/1990, 23 y.o.   MR[facility] [id number] [facility]  03/15/2014 4:33 PM Victoria GaribaldiCatherine Robbins  MRN:  [id number] Subjective:   Victoria EvansCatherine still c/o high anxiety.  Reports that the anxiety is persistent.  She shows some arm markings from "picking at skin" incessantly.  She has not noticed any improvements in anxiety.  Instead, she is more nervous and had a hard time sleeping.  She did notice improvement in her appetite.  She is wanting to go home.  She will stay with her parents.  She denies SI, HI, and AVH at this time.   Diagnosis:   DSM5: Schizophrenia Disorders:   Obsessive-Compulsive Disorders:   Trauma-Stressor Disorders:  Acute Stress Disorder (308.3) Substance/Addictive Disorders:   Depressive Disorders:  Major Depressive Disorder - Severe (296.23) Total Time spent with patient: 20 minutes  Axis I: Generalized Anxiety Disorder and Major Depression, Recurrent severe Axis II: Deferred Axis IV: economic problems, housing problems, other psychosocial or environmental problems, problems related to social environment, problems with access to health care services and problems with primary support group Axis V: 31-40 impairment in reality testing  ADL's:  Intact  Sleep: Fair  Appetite:  Poor  Suicidal Ideation:  Plan:  Denies Intent:  Denies Means:  Denies Homicidal Ideation:  Plan:  Denies Intent:  Denies Means:  Denies AEB (as evidenced by):  Psychiatric Specialty Exam: Physical Exam  Constitutional: She is oriented to person, place, and time. She appears well-developed.  Neck: Normal range of motion.  Musculoskeletal: Normal range of motion.  Neurological: She is alert and oriented to person, place, and time.  Skin: Skin is warm and dry.  2 small abrasions on her L lower arm (dorsal)    Review of Systems  HENT: Positive for congestion and sore throat.   Psychiatric/Behavioral: Positive for depression. Negative for suicidal  ideas, hallucinations, memory loss and substance abuse. The patient is nervous/anxious. The patient does not have insomnia.     Blood pressure 106/68, pulse 91, temperature 98.3 F (36.8 C), temperature source Oral, resp. rate 16, height 5' (1.524 m), weight 43.092 kg (95 lb).Body mass index is 18.55 kg/(m^2).  General Appearance: Casual and Fairly Groomed  Patent attorneyye Contact::  Fair  Speech:  Normal Rate  Volume:  Normal  Mood:  Depressed and Hopeless  Affect:  Depressed and Flat  Thought Process:  Circumstantial and Intact  Orientation:  Full (Time, Place, and Person)  Thought Content:  WDL  Suicidal Thoughts:  No  Homicidal Thoughts:  No  Memory:  Immediate;   Good Recent;   Good Remote;   Good  Judgement:  Intact  Insight:  Lacking  Psychomotor Activity:  Normal  Concentration:  Fair  Recall:  Fair  Fund of Knowledge:Fair  Language: Good  Akathisia:  No  Handed:  Right  AIMS (if indicated):     Assets:  ArchitectCommunication Skills Financial Resources/Insurance Physical Health Social Support Talents/Skills Vocational/Educational  Sleep:  Number of Hours: 6.25   Musculoskeletal: Strength & Muscle Tone: within normal limits Gait & Station: normal Patient leans: N/A  Current Medications: Current Facility-Administered Medications  Medication Dose Route Frequency Provider Last Rate Last Dose  . busPIRone (BUSPAR) tablet 7.5 mg  7.5 mg Oral BID Velna HatchetSheila May Agustin, NP      . hydrOXYzine (ATARAX/VISTARIL) tablet 50 mg  50 mg Oral TID PRN Velna HatchetSheila May Agustin, NP      . LORazepam (ATIVAN) tablet 0.5 mg  0.5 mg Oral  Q6H PRN Nehemiah MassedFernando Cobos, MD   0.5 mg at 03/14/14 0940  . magnesium hydroxide (MILK OF MAGNESIA) suspension 30 mL  30 mL Oral Daily PRN Shuvon Rankin, NP      . nicotine (NICODERM CQ - dosed in mg/24 hours) patch 21 mg  21 mg Transdermal Daily PRN Shuvon Rankin, NP      . OLANZapine-FLUoxetine (SYMBYAX) 6-25 MG per capsule 1 capsule  1 capsule Oral QHS Lindwood QuaSheila May Agustin, NP   1  capsule at 03/14/14 2229  . pseudoephedrine (SUDAFED) 12 hr tablet 120 mg  120 mg Oral Q12H PRN Truman Haywardakia S Starkes, FNP   120 mg at 03/14/14 1105  . traZODone (DESYREL) tablet 100 mg  100 mg Oral QHS PRN Lindwood QuaSheila May Agustin, NP        Lab Results:  Results for orders placed during the hospital encounter of 03/12/14 (from the past 48 hour(s))  TSH     Status: None   Collection Time    03/13/14  7:37 PM      Result Value Ref Range   TSH 4.340  0.350 - 4.500 uIU/mL   Comment: Performed at [facility]    Physical Findings: AIMS: Facial and Oral Movements Muscles of Facial Expression: None, normal Lips and Perioral Area: None, normal Jaw: None, normal Tongue: None, normal,Extremity Movements Upper (arms, wrists, hands, fingers): None, normal Lower (legs, knees, ankles, toes): None, normal, Trunk Movements Neck, shoulders, hips: None, normal, Overall Severity Severity of abnormal movements (highest score from questions above): None, normal Incapacitation due to abnormal movements: None, normal Patient's awareness of abnormal movements (rate only patient's report): No Awareness, Dental Status Current problems with teeth and/or dentures?: No Does patient usually wear dentures?: No  CIWA:  CIWA-Ar Total: 2 COWS:  COWS Total Score: 3  Treatment Plan Summary: Daily contact with patient to assess and evaluate symptoms and progress in treatment Medication management  Plan:Treatment Plan/Recommendations:  1 Admit for crisis management and stabilization. Estimated length of stay 5-7 days past her current stay of 2.  2 Individual and group therapy. 3 Medication management for depression, and anxiety to reduce current symptoms to base line and improve the overall levels of functioning: Medications reviewed with the patient and she stated no untoward effects.   Continue decongestant Sudafed 1 tablet po BID prn for congestion and rhinitis.  Trazodone 100 mg for insomnia, Hydroxyzine 50 mg  for anxiety and Buspar 7.5 for agitation/anxiety. 4 Coping skills for depression and anxiety developing.  5 Continue crisis stabilization and management.  6 Address health issues- monitor vital signs, stable. 7 Treatment plan in progress to prevent relapse prevention and self care.  8 Psychosocial education regarding relapse prevention and self care 9 Heath care follow up as needed for any health concerns 10 Call for consult with hospitalist for additional specialty patient services as needed.   Medical Decision Making Problem Points:  Established problem, worsening (2) and Review of psycho-social stressors (1) Data Points:  Review or order clinical lab tests (1) Review and summation of old records (2) Review of medication regiment & side effects (2) Review of new medications or change in dosage (2)  I certify that inpatient services furnished can reasonably be expected to improve the patient's condition.   Adonis BrookGUSTIN, SHEILA MAY, AGNP-BC 03/15/2014 4:33 PM  Reviewed the information documented and agree with the treatment plan.  Kaniel Kiang,JANARDHAHA R. 03/16/2014 8:15 AM

## 2014-03-15 NOTE — Progress Notes (Signed)
Patient ID: Victoria GaribaldiCatherine Robbins, female   DOB: 02/02/1991, 23 y.o.   MRN: [id number] D)  Pt was visiting with her sister this evening, and stated they had had a good visit.  Pt stated she was a little upset with her father who had told her he wanted her to stay here longer, that he thought she needed more help and wanted her to be better when she left here.  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  Stated her anxiety level is always high, feels overwhelmed by life's pressures to grow up and the responsibilities.   A)  Will continue to monitor for safety, support and encouragement, continue POC R)  Remains safe on unit.

## 2014-03-15 NOTE — [facility] Group Notes (Signed)
[facility] Group Notes:  (Clinical Social Work)  03/15/2014  10:00-11:00AM  Summary of Progress/Problems:   The main focus of today's process group was to   1)  discuss the importance of adding supports  2)  define health supports versus unhealthy supports  3)  identify the patient's current unhealthy supports and plan how to handle them  4)  Identify the patient's current healthy supports and plan what to add.  An emphasis was placed on using counselor, doctor, therapy groups, 12-step groups, and problem-specific support groups to expand supports.    The patient expressed full comprehension of the concepts presented, and agreed that there is a need to add more supports.  The patient stated she has good supports with her father, stepmother and sister.  However, she wishes she had more stable support from her mother, who she stated has considerable mental health issues.  The patient was very supportive to another young patient, was very insightful and helpful to that patient.  Type of Therapy:  Process Group with Motivational Interviewing  Participation Level:  Active  Participation Quality:  Attentive, Sharing and Supportive  Affect:  Blunted  Cognitive:  Alert, Appropriate and Oriented  Insight:  Engaged  Engagement in Therapy:  Engaged  Modes of Intervention:   Education, Support and Processing, Activity  Pilgrim's PrideMareida Grossman-Orr, LCSW 03/15/2014, 12:15pm

## 2014-03-15 NOTE — Progress Notes (Signed)
Patient ID: Victoria GaribaldiCatherine Robbins, female   DOB: 09/25/1990, 23 y.o.   MRN: [id number]   D: Pt has been very flat and depressed on the unit today, patient was in the bed most of the morning. Pt did not attend morning groups and did not attempt to engage in treatment. Pt has been very isolative and does not interacting with peers or staff. Pt did take all medications as prescribed by doctor. Pt reported being negative SI/HI, no AH/VH noted. A: 15 min checks continued for patient safety. R: Pt safety maintained.

## 2014-03-15 NOTE — [facility] Group Notes (Signed)
[facility] Group Notes:  (Nursing/MHT/Case Management/Adjunct)  Date:  03/15/2014  Time:  2:01 PM  Type of Therapy:  Psychoeducational Skills- Pt Self Inventory Group  Participation Level:  Did Not Attend  Victoria Robbins 03/15/2014, 2:01 PM 

## 2014-03-15 NOTE — Progress Notes (Signed)
Psychoeducational Group Note  Date:  03/15/2014 Time:  1015  Group Topic/Focus:  Making Healthy Choices:   The focus of this group is to help patients identify negative/unhealthy choices they were using prior to admission and identify positive/healthier coping strategies to replace them upon discharge.  Participation Level:  Active  Participation Quality:  Appropriate  Affect:  Flat  Cognitive:  Oriented  Insight:  Improving  Engagement in Group:  Engaged  Additional Comments:  Pt attended and participated in the group.    Laydon Martis A 03/15/2014  

## 2014-03-15 NOTE — Progress Notes (Signed)
Patient ID: Egbert GaribaldiCatherine Robbins, female   DOB: 01/14/1991, 23 y.o.   MRN: [id number] D)  Has been in the dayroom this evening, interacting appropriately wit staff and select peers.  Attended AA group this evening, found it interesting although she denies having substance abuse issues.  Watched card game in the dayroom, some interaction noted with peers, but mood remains depressed, sad.  Stated becoming an adult has been overwhelming and is thinking more and more about going home to live with her father and step mother. 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     A)  Will continue to monitor for safety, offer support and encouragement, continue POC R)  Safety maintained.

## 2014-03-15 NOTE — Progress Notes (Signed)
Patient did attend the evening speaker AA meeting.  

## 2014-03-16 DIAGNOSIS — F329 Major depressive disorder, single episode, unspecified: Secondary | ICD-10-CM | POA: Diagnosis present

## 2014-03-16 DIAGNOSIS — F32A Depression, unspecified: Secondary | ICD-10-CM | POA: Diagnosis present

## 2014-03-16 MED ORDER — BUSPIRONE HCL 7.5 MG PO TABS: 7.5000 mg | ORAL_TABLET | Freq: Two times a day (BID) | ORAL | Status: DC

## 2014-03-16 MED ORDER — TRAZODONE HCL 100 MG PO TABS: 100.0000 mg | ORAL_TABLET | Freq: Every evening | ORAL | Status: DC | PRN

## 2014-03-16 MED ORDER — OLANZAPINE-FLUOXETINE HCL 6-25 MG PO CAPS: 1.0000 | ORAL_CAPSULE | Freq: Every day | ORAL | Status: DC

## 2014-03-16 MED ORDER — HYDROXYZINE HCL 50 MG PO TABS: 50.0000 mg | ORAL_TABLET | Freq: Three times a day (TID) | ORAL | Status: DC | PRN

## 2014-03-16 NOTE — [facility] Suicide Risk Assessment (Signed)
[facility] INPATIENT:  Family/Significant Other Suicide Prevention Education  Suicide Prevention Education:  Patient Refusal for Family/Significant Other Suicide Prevention Education: The patient Egbert GaribaldiCatherine Si has refused to provide written consent for family/significant other to be provided Family/Significant Other Suicide Prevention Education during admission and/or prior to discharge.  Physician notified. CSW reviewed SPE and provided patient with brochure.  Avel Ogawa, West CarboKristin L 03/16/2014, 1:19 PM

## 2014-03-16 NOTE — Plan of Care (Signed)
Problem: Alteration in mood Goal: STG-Patient is able to discuss feelings and issues (Patient is able to discuss feelings and issues leading to depression)  Outcome: Progressing Pt has been able to identify some areas of stress which she has found to be overwhelming, which included trying to work, support herself , and trying to take classes. Her parents have encouraged her to move back home and take classes, which would greatly reduce her stress and enable her to focus on doing well and be successful.

## 2014-03-16 NOTE — Progress Notes (Signed)
Adult Psychoeducational Group Note  Date:  03/16/2014 Time:  10:00AM  Group Topic/Focus:  Dimensions of Wellness:   The focus of this group is to introduce the topic of wellness and discuss the role each dimension of wellness plays in total health.  Participation Level:  Active  Participation Quality:  Appropriate  Affect:  Appropriate  Cognitive:  Appropriate  Insight: Appropriate  Engagement in Group:  Engaged  Modes of Intervention:  Activity  Additional Comments:  Pt was active throughout group and participated in the group activity. Pt played "Wellness Jeopardy" with peers   Masako Overall K 03/16/2014, 10:19 AM

## 2014-03-16 NOTE — Plan of Care (Signed)
Problem: Alteration in mood Goal: STG-Pt Able to Identify Plan For Continuing Care at D/C Pt. Will be able to identify a plan for continuing care at discharge  Outcome: Progressing Pt reports that being here in this environment has been stressful.  She feels she could progress more as an outpatient and reports her family will be supportive of that plan.
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
## 2014-03-16 NOTE — [facility] Group Notes (Signed)
[facility] LCSW Group Therapy 03/16/2014  1:15 pm  Type of Therapy: Group Therapy Participation Level: Active  Participation Quality: Attentive, Sharing and Supportive  Affect: Depressed and Flat  Cognitive: Alert and Oriented  Insight: Developing/Improving and Engaged  Engagement in Therapy: Developing/Improving and Engaged  Modes of Intervention: Clarification, Confrontation, Discussion, Education, Exploration,  Limit-setting, Orientation, Problem-solving, Rapport Building, Dance movement psychotherapisteality Testing, Socialization and Support  Summary of Progress/Problems: Pt identified obstacles faced currently and processed barriers involved in overcoming these obstacles. Pt identified steps necessary for overcoming these obstacles and explored motivation (internal and external) for facing these difficulties head on. Pt further identified one area of concern in their lives and chose a goal to focus on for today. Patient reports that her primary obstacle is her anxiety and tendency to catastrophize and ruminate. PhD intern demonstrated deep breathing exercise and emphasized importance of individual therapy to address anxiety needs. CSW's offered emotional support and encouragement.    Samuella BruinKristin Jenice Leiner, MSW, [employer]CS[employer] Clinical Social Worker [facility] [phone]
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
## 2014-03-16 NOTE — Progress Notes (Signed)
[facility] :  Will you be returning to the same living situation after discharge: No. Patient will be staying with family At discharge, do you have transportation home?:Yes,  patient's sister will be picking her up Do you have the ability to pay for your medications:Yes,  patient will be provided with prescriptions at discharge.  Release of information consent forms completed and in the chart;  Patient's signature needed at discharge.  Patient to Follow up at: Follow-up Information   [facility] with [facility] On 03/23/2014. (Please present to [facility] on Monday Oct. 26th at 12:30 pm for assessment for therapy and medication management services. Bring your photo I.D., social security card, insurance card, and proof of income. Please call office if you need to reschedule.)    Contact information:   Phone: [phone]  Fax: [phone]  Address: [address], [place], [place] [address]      Patient denies SI/HI:   Yes,  denies    Safety Planning and Suicide Prevention discussed:  Yes,  with patient  Heide Brossart, [place]ristin L 03/16/2014, 1:20 PM

## 2014-03-16 NOTE — Progress Notes (Signed)
Discharge Note: Discharge instructions/prescriptions/medication samples and letter given to patient. Patient verbalized understanding of discharge instructions and prescriptions. Returned belongings to patient. Denies SI/HI/AVH. Patient d/c without incident to the front lobby and transported home by her sister.

## 2014-03-16 NOTE — [facility] Group Notes (Signed)
   [facility]  03/16/2014  8:45 AM   Participation Quality: Alert, Appropriate and Oriented  Mood/Affect: Depressed and Flat  Depression Rating: 2 or 3  Anxiety Rating: 7 or 8  Thoughts of Suicide: Pt denies SI/HI  Will you contract for safety? Yes  Current AVH: Pt denies  Plan for Discharge/Comments: Pt attended discharge planning group and actively participated in group. CSW provided pt with today's workbook. Patient reports that she feels "okay" today but endorses high anxiety related to being hospitalized. She is requesting to be discharged [place] to follow up with outpatient services.  Transportation Means: Pt reports access to transportation  Supports: No supports mentioned at this time  Samuella BruinKristin Braelee Herrle, MSW, [employer] Clinical Social Worker [facility] [phone]

## 2014-03-16 NOTE — Progress Notes (Signed)
[facility] Group Notes:  (Nursing/MHT/Case Management/Adjunct)  Date:  03/16/2014  Time:  2:55 PM  Type of Therapy:  Therapeutic Activity  Participation Level:  Did Not Attend   Summary of Progress/Problems: Pt was taking a shower at the time of group.  Caswell CorwinOwen, [place] Wherry C 03/16/2014, 2:55 PM

## 2014-03-16 NOTE — Progress Notes (Deleted)
[facility] Group Notes:  (Nursing/MHT/Case Management/Adjunct)  Date:  03/16/2014  Time:  2:28 PM  Type of Therapy:  Therapeutic Activity  Participation Level:  Active  Participation Quality:  Appropriate, Attentive and Supportive  Affect:  Appropriate  Activity: Patients participated in jeopardy like game, answering questions relating to wellness in categories such as Fruits, Fitness, Veggies, Exercise, and Food Safety.  Tyna Huertas C 03/16/2014, 2:28 PM 

## 2014-03-16 NOTE — Progress Notes (Addendum)
D: Patient has anxious affect and depressed mood. She reported on the self inventory sheet that sleep and ability to concentrate are poor, good appetite and normal energy level. Patient rates depression today at a "5", feelings of hopelessness "3" and anxiety "10". She is actively participating in groups and visible in the milieu. This morning the pt. complained of not being able to get to sleep last night, even after taking 100 mg of Trazodone. Writer informed the MD, [place] regarding this matter. Patient is compliant with medication regimen. Her goal today is to lessen her anxiety and learn coping skills.  A: Support and encouragement provided to patient. Scheduled medications administered per MD orders. Maintain Q15 minute checks for safety.  R: Patient receptive. Denies SI/HI/AVH. Patient remains safe.
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
## 2014-03-17 ENCOUNTER — Ambulatory Visit (INDEPENDENT_AMBULATORY_CARE_PROVIDER_SITE_OTHER): Payer: BC Managed Care – PPO | Admitting: Neurology

## 2014-03-17 ENCOUNTER — Encounter: Payer: Self-pay | Admitting: Neurology

## 2014-03-17 VITALS — BP 103/63 | HR 88 | Temp 98.2°F | Resp 12 | Ht 60.0 in | Wt 100.0 lb

## 2014-03-17 DIAGNOSIS — R4020 Unspecified coma: Secondary | ICD-10-CM

## 2014-03-17 DIAGNOSIS — I499 Cardiac arrhythmia, unspecified: Secondary | ICD-10-CM

## 2014-03-17 DIAGNOSIS — R404 Transient alteration of awareness: Secondary | ICD-10-CM

## 2014-03-17 NOTE — Patient Instructions (Signed)
Overall you are doing fairly well but I do want to suggest a few things today:   Remember to drink plenty of fluid, eat healthy meals and do not skip any meals. Try to eat protein with a every meal and eat a healthy snack such as fruit or nuts in between meals. Try to keep a regular sleep-wake schedule and try to exercise daily, particularly in the form of walking, 20-30 minutes a day, if you can.   As far as your medications are concerned, I would like to suggest: none at this time  As far as diagnostic testing: EEG and EKG  I would like to see you back in 3 months, sooner if we need to. Please call us with any interim questions, concerns, problems, updates or refill requests.   Please also call us for any test results so we can go over those with you on the phone.  My clinical assistant and will answer any of your questions and relay your messages to me and also relay most of my messages to you.   Our phone number is [phone]. We also have an after hours call service for urgent matters and there is a physician on-call for urgent questions. For any emergencies you know to call 911 or go to the nearest emergency room
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
## 2014-03-18 NOTE — Consult Note (Signed)
Face to face evaluation and I agree with this note 

## 2014-03-19 NOTE — Progress Notes (Signed)
Patient Discharge Instructions:  After Visit Summary (AVS):   Faxed to:  03/19/14 Discharge Summary Note:   Faxed to:  03/19/14 Psychiatric Admission Assessment Note:   Faxed to:  03/19/14 Suicide Risk Assessment - Discharge Assessment:   Faxed to:  03/19/14 Faxed/Sent to the Next Level Care provider:  03/19/14 [facility] @ [phone]   Jerelene ReddenSheena E [place], 03/19/2014, 3:17 PM

## 2014-03-20 ENCOUNTER — Ambulatory Visit (INDEPENDENT_AMBULATORY_CARE_PROVIDER_SITE_OTHER): Payer: BC Managed Care – PPO | Admitting: Family Medicine

## 2014-03-20 VITALS — BP 100/70 | HR 100 | Temp 99.1°F | Resp 18 | Ht 61.5 in | Wt 95.6 lb

## 2014-03-20 DIAGNOSIS — J029 Acute pharyngitis, unspecified: Secondary | ICD-10-CM

## 2014-03-20 DIAGNOSIS — J020 Streptococcal pharyngitis: Secondary | ICD-10-CM

## 2014-03-20 LAB — POCT RAPID STREP A (OFFICE): Rapid Strep A Screen: POSITIVE — AB

## 2014-03-20 MED ORDER — AMOXICILLIN 400 MG/5ML PO SUSR: ORAL | Status: DC

## 2014-03-20 NOTE — Progress Notes (Signed)
Chief Complaint:  Chief Complaint  Patient presents with  . Emesis    x2 days  . Fever    x2 days up to 105  . Sore Throat    x2 days  . Fatigue    x2 days  . Generalized Body Aches    x2 days  . Adenopathy    x2 days    HPI: Victoria Robbins is a [age] y.o. female who is here for  2 day history fevers, chills, sore throat, fatigue ( slept all day yesterday)  No history of strep, but is staying with her sister and  her sister's daughter has it. She was snuggling up to her 327 y/o niece. She has had temp max of 104.5 She has taken motrin for this. She has no other drug allergies. Last dose was last night.   She also was recently put on meds for bipolar 1, anxiety after being hospitalized for MDD , anxiety and suicide attempt trying to OD on motrin and xanax  Past Medical History  Diagnosis Date  . Bipolar 1 disorder   . Anxiety and depression   . Suicide attempt by multiple drug overdose     03/12/2014 -was trying to OD on motrin and xanax   Past Surgical History  Procedure Laterality Date  . No past surgeries     History   Social History  . Marital Status: Single    Spouse Name: N/A    Number of Children: 0  . Years of Education: Student   Occupational History  .  Other    Student   Social History Main Topics  . Smoking status: Never Smoker   . Smokeless tobacco: Never Used  . Alcohol Use: Yes     Comment: occ  . Drug Use: Yes    Special: Marijuana     Comment: occ  . Sexual Activity: Yes   Other Topics Concern  . None   Social History Narrative   Patient lives at home with parents.   Caffeine Use: 2-3 cups daily   Family History  Problem Relation Age of Onset  . Seizures Mother   . Seizures Sister   . Seizures Maternal Grandmother    Allergies  Allergen Reactions  . Other Rash    A Depression medication, pt not sure of name.  . Lamictal [Lamotrigine] Hives   Prior to Admission medications   Medication Sig Start Date End Date Taking?  Authorizing Provider  busPIRone (BUSPAR) 7.5 MG tablet Take 1 tablet (7.5 mg total) by mouth 2 (two) times daily. 03/16/14  Yes Velna HatchetSheila May Agustin, NP  hydrOXYzine (ATARAX/VISTARIL) 50 MG tablet Take 1 tablet (50 mg total) by mouth 3 (three) times daily as needed for anxiety. 03/16/14  Yes Velna HatchetSheila May Agustin, NP  OLANZapine-FLUoxetine (SYMBYAX) 6-25 MG per capsule Take 1 capsule by mouth at bedtime. For depression 03/16/14  Yes Velna HatchetSheila May Agustin, NP  traZODone (DESYREL) 100 MG tablet Take 1 tablet (100 mg total) by mouth at bedtime as needed for sleep. 03/16/14  Yes Velna HatchetSheila May Agustin, NP     ROS: The patient denies night sweats, unintentional weight loss, chest pain, palpitations, wheezing, dyspnea on exertion, nausea, vomiting, abdominal pain, dysuria, hematuria, melena, numbness, weakness, or tingling.  All other systems have been reviewed and were otherwise negative with the exception of those mentioned in the HPI and as above.    PHYSICAL EXAM: Filed Vitals:   03/20/14 0847  BP: 100/70  Pulse: 100  Temp: 99.1 F (37.3 C)  Resp: 18   Filed Vitals:   03/20/14 0847  Height: 5' 1.5" (1.562 m)  Weight: 95 lb 9.6 oz (43.364 kg)   Body mass index is 17.77 kg/(m^2).  General: Alert, no acute distress HEENT:  Normocephalic, atraumatic, oropharynx patent. EOMI, PERRLA, + exudates, erythematous tonsisl, +2, Tm nl Cardiovascular:  Regular rate and rhythm, no rubs murmurs or gallops.  Radial pulse intact. No pedal edema.  Respiratory: Clear to auscultation bilaterally.  No wheezes, rales, or rhonchi.  No cyanosis, no use of accessory musculature GI: No organomegaly, abdomen is soft and non-tender, positive bowel sounds.  No masses. Skin: No rashes. Neurologic: Facial musculature symmetric. Psychiatric: Patient is appropriate throughout our interaction. Lymphatic: + spotty anterior cervical lymphadenopathy bialterally Musculoskeletal: Gait intact.   LABS: Results for orders placed in  visit on 03/20/14  POCT RAPID STREP A (OFFICE)      Result Value Ref Range   Rapid Strep A Screen Positive (*) Negative     EKG/XRAY:   [facility]   ASSESSMENT/PLAN: Encounter Diagnoses  Name Primary?  . Pharyngitis   . Streptococcal sore throat Yes   Amoxacilin 7 ml sus TID x 10 days F/u prn  Gross sideeffects, risk and benefits, and alternatives of medications d/w patient. Patient is aware that all medications have potential sideeffects and we are unable to predict every sideeffect or drug-drug interaction that may occur.  LE, THAO PHUONG, DO 03/21/2014 10:04 AM

## 2014-03-20 NOTE — Patient Instructions (Signed)

## 2014-03-21 ENCOUNTER — Encounter: Payer: Self-pay | Admitting: Family Medicine

## 2014-03-24 ENCOUNTER — Other Ambulatory Visit: Payer: BC Managed Care – PPO | Admitting: Radiology

## 2015-11-09 ENCOUNTER — Emergency Department (HOSPITAL_COMMUNITY): Payer: Self-pay

## 2015-11-09 ENCOUNTER — Emergency Department (HOSPITAL_COMMUNITY)
Admission: EM | Admit: 2015-11-09 | Discharge: 2015-11-09 | Disposition: A | Discharge status: Home / Self Care | Payer: Self-pay | Attending: Emergency Medicine | Admitting: Emergency Medicine

## 2015-11-09 ENCOUNTER — Encounter (HOSPITAL_COMMUNITY): Payer: Self-pay

## 2015-11-09 DIAGNOSIS — B9689 Other specified bacterial agents as the cause of diseases classified elsewhere: Secondary | ICD-10-CM

## 2015-11-09 DIAGNOSIS — R109 Unspecified abdominal pain: Secondary | ICD-10-CM

## 2015-11-09 DIAGNOSIS — R1030 Lower abdominal pain, unspecified: Secondary | ICD-10-CM | POA: Insufficient documentation

## 2015-11-09 DIAGNOSIS — Z79899 Other long term (current) drug therapy: Secondary | ICD-10-CM | POA: Insufficient documentation

## 2015-11-09 DIAGNOSIS — N760 Acute vaginitis: Secondary | ICD-10-CM | POA: Insufficient documentation

## 2015-11-09 LAB — URINALYSIS, ROUTINE W REFLEX MICROSCOPIC
Bilirubin Urine: NEGATIVE
GLUCOSE, UA: NEGATIVE mg/dL
KETONES UR: NEGATIVE mg/dL
LEUKOCYTES UA: NEGATIVE
NITRITE: NEGATIVE
PROTEIN: NEGATIVE mg/dL
Specific Gravity, Urine: 1.016 (ref 1.005–1.030)
pH: 8 (ref 5.0–8.0)

## 2015-11-09 LAB — COMPREHENSIVE METABOLIC PANEL
ALK PHOS: 86 U/L (ref 38–126)
ALT: 14 U/L (ref 14–54)
ANION GAP: 10 (ref 5–15)
AST: 19 U/L (ref 15–41)
Albumin: 4.4 g/dL (ref 3.5–5.0)
BILIRUBIN TOTAL: 0.5 mg/dL (ref 0.3–1.2)
BUN: 7 mg/dL (ref 6–20)
CO2: 22 mmol/L (ref 22–32)
CREATININE: 0.58 mg/dL (ref 0.44–1.00)
Calcium: 9.5 mg/dL (ref 8.9–10.3)
Chloride: 107 mmol/L (ref 101–111)
GFR calc Af Amer: >60 mL/min (ref >60)
Glucose, Bld: 80 mg/dL (ref 65–99)
Potassium: 3.8 mmol/L (ref 3.5–5.1)
Sodium: 139 mmol/L (ref 135–145)
TOTAL PROTEIN: 7.1 g/dL (ref 6.5–8.1)

## 2015-11-09 LAB — CBC WITH DIFFERENTIAL/PLATELET
BASOS ABS: 0 10*3/uL (ref 0.0–0.1)
BASOS PCT: 1 %
EOS ABS: 0.1 10*3/uL (ref 0.0–0.7)
Eosinophils Relative: 1 %
HEMATOCRIT: 38.8 % (ref 36.0–46.0)
HEMOGLOBIN: 12.7 g/dL (ref 12.0–15.0)
Lymphocytes Relative: 30 %
Lymphs Abs: 1.9 10*3/uL (ref 0.7–4.0)
MCH: 29.1 pg (ref 26.0–34.0)
MCHC: 32.7 g/dL (ref 30.0–36.0)
MCV: 89 fL (ref 78.0–100.0)
MONOS PCT: 7 %
Monocytes Absolute: 0.5 10*3/uL (ref 0.1–1.0)
NEUTROS ABS: 3.8 10*3/uL (ref 1.7–7.7)
NEUTROS PCT: 61 %
Platelets: 225 10*3/uL (ref 150–400)
RBC: 4.36 MIL/uL (ref 3.87–5.11)
RDW: 11.5 % (ref 11.5–15.5)
WBC: 6.3 10*3/uL (ref 4.0–10.5)

## 2015-11-09 LAB — URINE MICROSCOPIC-ADD ON

## 2015-11-09 LAB — WET PREP, GENITAL
SPERM: NONE SEEN
TRICH WET PREP: NONE SEEN

## 2015-11-09 LAB — POC URINE PREG, ED: PREG TEST UR: NEGATIVE

## 2015-11-09 MED ORDER — ALPRAZOLAM 0.5 MG PO TABS
0.5000 mg | ORAL_TABLET | Freq: Once | ORAL | Status: AC
  Administered 2015-11-09: 0.5 mg via ORAL
  Filled 2015-11-09: qty 1

## 2015-11-09 MED ORDER — LIDOCAINE HCL (PF) 1 % IJ SOLN
INTRAMUSCULAR | Status: AC
  Administered 2015-11-09: 5 mL
  Filled 2015-11-09: qty 5

## 2015-11-09 MED ORDER — CEFTRIAXONE SODIUM 250 MG IJ SOLR
250.0000 mg | Freq: Once | INTRAMUSCULAR | Status: AC
  Administered 2015-11-09: 250 mg via INTRAMUSCULAR
  Filled 2015-11-09: qty 250

## 2015-11-09 MED ORDER — DOXYCYCLINE HYCLATE 100 MG PO TABS
100.0000 mg | ORAL_TABLET | Freq: Once | ORAL | Status: AC
  Administered 2015-11-09: 100 mg via ORAL
  Filled 2015-11-09: qty 1

## 2015-11-09 MED ORDER — DEXTROSE 5 % IV SOLN: 250.0000 mg | Freq: Once | INTRAVENOUS | Status: DC

## 2015-11-09 MED ORDER — DOXYCYCLINE HYCLATE 100 MG PO CAPS: 100.0000 mg | ORAL_CAPSULE | Freq: Two times a day (BID) | ORAL | Status: DC

## 2015-11-09 MED ORDER — METRONIDAZOLE 500 MG PO TABS
500.0000 mg | ORAL_TABLET | Freq: Two times a day (BID) | ORAL | Status: DC
Start: 2015-11-09 — End: 2016-03-20

## 2015-11-09 MED ORDER — SODIUM CHLORIDE 0.9 % IV BOLUS (SEPSIS)
500.0000 mL | Freq: Once | INTRAVENOUS | Status: AC
  Administered 2015-11-09: 500 mL via INTRAVENOUS

## 2015-11-09 MED ORDER — ONDANSETRON HCL 4 MG/2ML IJ SOLN
4.0000 mg | Freq: Once | INTRAMUSCULAR | Status: AC
  Administered 2015-11-09: 4 mg via INTRAVENOUS
  Filled 2015-11-09: qty 2

## 2015-11-09 MED ORDER — METRONIDAZOLE 500 MG PO TABS
500.0000 mg | ORAL_TABLET | Freq: Once | ORAL | Status: AC
  Administered 2015-11-09: 500 mg via ORAL
  Filled 2015-11-09: qty 1

## 2015-11-09 MED ORDER — KETOROLAC TROMETHAMINE 15 MG/ML IJ SOLN
15.0000 mg | Freq: Once | INTRAMUSCULAR | Status: AC
  Administered 2015-11-09: 15 mg via INTRAVENOUS
  Filled 2015-11-09: qty 1

## 2015-11-09 NOTE — ED Notes (Signed)
Pt. Transported to CT at this time.  

## 2015-11-09 NOTE — Discharge Instructions (Signed)
Abdominal Pain, Adult °Many things can cause abdominal pain. Usually, abdominal pain is not caused by a disease and will improve without treatment. It can often be observed and treated at home. Your health care provider will do a physical exam and possibly order blood tests and X-rays to help determine the seriousness of your pain. However, in many cases, more time must pass before a clear cause of the pain can be found. Before that point, your health care provider may not know if you need more testing or further treatment. °HOME CARE INSTRUCTIONS °Monitor your abdominal pain for any changes. The following actions may help to alleviate any discomfort you are experiencing: °· Only take over-the-counter or prescription medicines as directed by your health care provider. °· Do not take laxatives unless directed to do so by your health care provider. °· Try a clear liquid diet (broth, tea, or water) as directed by your health care provider. Slowly move to a bland diet as tolerated. °SEEK MEDICAL CARE IF: °· You have unexplained abdominal pain. °· You have abdominal pain associated with nausea or diarrhea. °· You have pain when you urinate or have a bowel movement. °· You experience abdominal pain that wakes you in the night. °· You have abdominal pain that is worsened or improved by eating food. °· You have abdominal pain that is worsened with eating fatty foods. °· You have a fever. °SEEK IMMEDIATE MEDICAL CARE IF: °· Your pain does not go away within 2 hours. °· You keep throwing up (vomiting). °· Your pain is felt only in portions of the abdomen, such as the right side or the left lower portion of the abdomen. °· You pass bloody or black tarry stools. °MAKE SURE YOU: °· Understand these instructions. °· Will watch your condition. °· Will get help right away if you are not doing well or get worse. °  °This information is not intended to replace advice given to you by your health care provider. Make sure you discuss  any questions you have with your health care provider. °  °Document Released: 02/22/2005 Document Revised: 02/03/2015 Document Reviewed: 01/22/2013 °Elsevier Interactive Patient Education ©2016 [employer] ° °Bacterial Vaginosis °Bacterial vaginosis is a vaginal infection that occurs when the normal balance of bacteria in the vagina is disrupted. It results from an overgrowth of certain bacteria. This is the most common vaginal infection in women of childbearing age. Treatment is important to prevent complications, especially in pregnant women, as it can cause a premature delivery. °CAUSES  °Bacterial vaginosis is caused by an increase in harmful bacteria that are normally present in smaller amounts in the vagina. Several different kinds of bacteria can cause bacterial vaginosis. However, the reason that the condition develops is not fully understood. °RISK FACTORS °Certain activities or behaviors can put you at an increased risk of developing bacterial vaginosis, including: °· Having a new sex partner or multiple sex partners. °· Douching. °· Using an intrauterine device (IUD) for contraception. °Women do not get bacterial vaginosis from toilet seats, bedding, swimming pools, or contact with objects around them. °SIGNS AND SYMPTOMS  °Some women with bacterial vaginosis have no signs or symptoms. Common symptoms include: °· Grey vaginal discharge. °· A fishlike odor with discharge, especially after sexual intercourse. °· Itching or burning of the vagina and vulva. °· Burning or pain with urination. °DIAGNOSIS  °Your health care provider will take a medical history and examine the vagina for signs of bacterial vaginosis. A sample of vaginal fluid may   be taken. Your health care provider will look at this sample under a microscope to check for bacteria and abnormal cells. A vaginal pH test may also be done.  °TREATMENT  °Bacterial vaginosis may be treated with antibiotic medicines. These may be given in the form of a  pill or a vaginal cream. A second round of antibiotics may be prescribed if the condition comes back after treatment. Because bacterial vaginosis increases your risk for sexually transmitted diseases, getting treated can help reduce your risk for chlamydia, gonorrhea, HIV, and herpes. °HOME CARE INSTRUCTIONS  °· Only take over-the-counter or prescription medicines as directed by your health care provider. °· If antibiotic medicine was prescribed, take it as directed. Make sure you finish it even if you start to feel better. °· Tell all sexual partners that you have a vaginal infection. They should see their health care provider and be treated if they have problems, such as a mild rash or itching. °· During treatment, it is important that you follow these instructions: °¨ Avoid sexual activity or use condoms correctly. °¨ Do not douche. °¨ Avoid alcohol as directed by your health care provider. °¨ Avoid breastfeeding as directed by your health care provider. °SEEK MEDICAL CARE IF:  °· Your symptoms are not improving after 3 days of treatment. °· You have increased discharge or pain. °· You have a fever. °MAKE SURE YOU:  °· Understand these instructions. °· Will watch your condition. °· Will get help right away if you are not doing well or get worse. °FOR MORE INFORMATION  °Centers for Disease Control and Prevention, Division of STD Prevention: www.cdc.gov/std °American Sexual Health Association (ASHA): www.ashastd.org  °  °This information is not intended to replace advice given to you by your health care provider. Make sure you discuss any questions you have with your health care provider. °  °Document Released: 05/15/2005 Document Revised: 06/05/2014 Document Reviewed: 12/25/2012 °Elsevier Interactive Patient Education ©2016 [employer] Inc. ° ° °

## 2015-11-09 NOTE — ED Provider Notes (Signed)
CSN: [id number]     Arrival date & time 11/09/15  1302 History  By signing my name below, I, Victoria Robbins, attest that this documentation has been prepared under the direction and in the presence of Victoria Fossa, MD. Electronically Signed: Evon Robbins, ED Scribe. 11/09/2015. 5:48 PM.    Chief Complaint  Patient presents with  . Flank Pain   The history is provided by the patient. No language interpreter was used.   HPI Comments: Victoria Robbins is a 25 y.o. female who presents to the Emergency Department complaining of right sided flank pain onset 1 day prior. Pt reports associated vomiting. She states that the pain is radiating to her abdomen and towards her her groin. Pt also reports that she is having some vaginal discharge as well. She has had a recent new sexual partner. Pt states that she may have had a syncopal episode today while vomiting. Pt states the believes she is passing a kidney stone. Pt states that her pain is worse in the morning. Denies fever or dysuria. Pt reports Hx of frequent UTI's. Reports Hx of irregular menstrual cycle.     Past Medical History  Diagnosis Date  . Bipolar 1 disorder (HCC)   . Anxiety and depression   . Suicide attempt by multiple drug overdose (HCC)     03/12/2014 -was trying to OD on motrin and xanax   Past Surgical History  Procedure Laterality Date  . No past surgeries     Family History  Problem Relation Age of Onset  . Seizures Mother   . Seizures Sister   . Seizures Maternal Grandmother    Social History  Substance Use Topics  . Smoking status: Never Smoker   . Smokeless tobacco: Never Used  . Alcohol Use: Yes     Comment: occ   OB History    No data available      Review of Systems  Constitutional: Negative for fever.  Gastrointestinal: Positive for vomiting and abdominal pain.  Genitourinary: Positive for flank pain and vaginal discharge. Negative for dysuria.  All other systems reviewed and are  negative.    Allergies  Other; Buspar; and Lamictal  Home Medications   Prior to Admission medications   Medication Sig Start Date End Date Taking? Authorizing Provider  Biotin (BIOTIN 5000) 5 MG CAPS Take 5 mg by mouth daily.   Yes Historical Provider, MD  doxycycline (VIBRAMYCIN) 100 MG capsule Take 1 capsule (100 mg total) by mouth 2 (two) times daily. 11/09/15   Victoria Fossa, MD  metroNIDAZOLE (FLAGYL) 500 MG tablet Take 1 tablet (500 mg total) by mouth 2 (two) times daily. 11/09/15   Victoria Fossa, MD   BP 95/65 mmHg  Pulse 57  Temp(Src) 97.9 F (36.6 C) (Oral)  Resp 18  SpO2 99%   Physical Exam  Constitutional: She is oriented to person, place, and time. She appears well-developed and well-nourished.  HENT:  Head: Normocephalic and atraumatic.  Cardiovascular: Normal rate and regular rhythm.   No murmur heard. Pulmonary/Chest: Effort normal and breath sounds normal. No respiratory distress.  Abdominal: Soft. There is no tenderness. There is no rebound and no guarding.  Mild lower abdominal tenderness.   Genitourinary:  Moderate white vaginal discharge, os closed, mild right adnexal tenderness  Musculoskeletal: She exhibits no edema or tenderness.  Neurological: She is alert and oriented to person, place, and time.  Skin: Skin is warm and dry.  Psychiatric: She has a normal mood and affect. Her behavior is  normal.  Nursing note and vitals reviewed.   ED Course  Procedures (including critical care time) DIAGNOSTIC STUDIES: Oxygen Saturation is 100% on RA, normal by my interpretation.    COORDINATION OF CARE: 5:47 PM-Discussed treatment plan with pt at bedside and pt agreed to plan.     Labs Review Labs Reviewed  WET PREP, GENITAL - Abnormal; Notable for the following:    Yeast Wet Prep HPF POC PRESENT (*)    Clue Cells Wet Prep HPF POC PRESENT (*)    WBC, Wet Prep HPF POC MANY (*)    All other components within normal limits  URINALYSIS, ROUTINE W REFLEX  MICROSCOPIC (NOT AT [facility]) - Abnormal; Notable for the following:    Hgb urine dipstick MODERATE (*)    All other components within normal limits  URINE MICROSCOPIC-ADD ON - Abnormal; Notable for the following:    Squamous Epithelial / LPF 0-5 (*)    Bacteria, UA FEW (*)    All other components within normal limits  COMPREHENSIVE METABOLIC PANEL  CBC WITH DIFFERENTIAL/PLATELET  RPR  HIV ANTIBODY (ROUTINE TESTING)  POC URINE PREG, ED  GC/CHLAMYDIA PROBE AMP ([place]) NOT AT [facility]    Imaging Review Ct Renal Stone Study  11/09/2015  CLINICAL DATA:  Right-sided flank pain for 1 day.  Nausea. EXAM: CT ABDOMEN AND PELVIS WITHOUT CONTRAST TECHNIQUE: Multidetector CT imaging of the abdomen and pelvis was performed following the standard protocol without IV contrast. COMPARISON:  None. FINDINGS: The kidneys are symmetrical in size and shape. No hydronephrosis or hydroureter. No renal, ureteral, or bladder stones. The bladder is somewhat distended with urine but no wall thickening or filling defects are demonstrated. The unenhanced appearance of the liver, spleen, pancreas, gallbladder, adrenal glands, abdominal aorta, inferior vena cava, and retroperitoneal lymph nodes is unremarkable. Stomach, small bowel, and colon are mostly decompressed. Scattered stool throughout the colon. Scattered fluid in the small bowel. No free air or free fluid in the abdomen. Pelvis: Appendix is normal. Uterus and ovaries are not enlarged. No free or loculated pelvic fluid collections. No pelvic mass or lymphadenopathy. No destructive bone lesions. IMPRESSION: No renal or ureteral stone or obstruction. Nonspecific distention of the bladder, possibly physiologic or due to a bladder outlet obstruction. Electronically Signed   By: Burman NievesWilliam  Stevens M.D.   On: 11/09/2015 22:26   I have personally reviewed and evaluated these lab results as part of my medical decision-making.   EKG Interpretation None      MDM   Final  diagnoses:  Right flank pain  BV (bacterial vaginosis)    Patient here for evaluation of right flank pain, vaginal discharge. She has discharge on examination with mild adnexal tenderness without fullness. No CMT. CT stone study obtained that was negative for obstructing stones. Treating for potential PID given her pain and discharge as well as BV. Discussed home care, outpatient follow-up, return precautions.  I personally performed the services described in this documentation, which was scribed in my presence. The recorded information has been reviewed and is accurate.      Victoria FossaElizabeth Stori Royse, MD 11/09/15 2350

## 2015-11-09 NOTE — ED Notes (Addendum)
Patient complains of right sided flank pain x 1 day with vomiting. Denies dysuria but frequency. States the past 2 mornings the pain has ben more intense and this am became near syncopal with the pain

## 2015-11-09 NOTE — ED Notes (Signed)
Pt. sts she is still in pain after medication. EDP made aware.

## 2015-11-10 LAB — HIV ANTIBODY (ROUTINE TESTING W REFLEX): HIV SCREEN 4TH GENERATION: NONREACTIVE

## 2015-11-10 LAB — GC/CHLAMYDIA PROBE AMP (~~LOC~~) NOT AT ARMC
Chlamydia: NEGATIVE
Neisseria Gonorrhea: NEGATIVE

## 2015-11-10 LAB — RPR: RPR: NONREACTIVE
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
## 2016-03-19 ENCOUNTER — Ambulatory Visit (HOSPITAL_COMMUNITY)
Admission: EM | Admit: 2016-03-19 | Discharge: 2016-03-19 | Disposition: A | Discharge status: Nursing Home (Includes ICF) | Payer: Self-pay | Attending: Family Medicine | Admitting: Family Medicine

## 2016-03-19 ENCOUNTER — Encounter (HOSPITAL_COMMUNITY): Payer: Self-pay | Admitting: Emergency Medicine

## 2016-03-19 ENCOUNTER — Encounter (HOSPITAL_COMMUNITY): Payer: Self-pay

## 2016-03-19 DIAGNOSIS — L03115 Cellulitis of right lower limb: Secondary | ICD-10-CM | POA: Diagnosis present

## 2016-03-19 DIAGNOSIS — L03116 Cellulitis of left lower limb: Secondary | ICD-10-CM | POA: Diagnosis present

## 2016-03-19 DIAGNOSIS — F209 Schizophrenia, unspecified: Secondary | ICD-10-CM | POA: Diagnosis present

## 2016-03-19 DIAGNOSIS — R000 Tachycardia, unspecified: Secondary | ICD-10-CM | POA: Diagnosis present

## 2016-03-19 DIAGNOSIS — A419 Sepsis, unspecified organism: Principal | ICD-10-CM | POA: Diagnosis present

## 2016-03-19 DIAGNOSIS — Z888 Allergy status to other drugs, medicaments and biological substances status: Secondary | ICD-10-CM

## 2016-03-19 DIAGNOSIS — Z79899 Other long term (current) drug therapy: Secondary | ICD-10-CM

## 2016-03-19 NOTE — ED Triage Notes (Addendum)
[facility] with a complaint of bilateral leg and knee pain secondary to a fall that occurred on October 9,2017. The patient reported that she fell through a loose manhole cover. The patient reported that when she woke today her legs were swollen and red and hard to bend.  The patient reported that she spiked a fever 5 days ago and was treated at the [facility] and placed on PCN 500 mg.
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
## 2016-03-19 NOTE — Discharge Instructions (Signed)
Must go to the emergency immediately.
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
## 2016-03-20 ENCOUNTER — Inpatient Hospital Stay (HOSPITAL_COMMUNITY)
Admission: EM | Admit: 2016-03-20 | Discharge: 2016-03-20 | DRG: 872 | Disposition: A | Discharge status: Home / Self Care | Payer: BC Managed Care – PPO | Attending: Emergency Medicine | Admitting: Emergency Medicine

## 2016-03-20 DIAGNOSIS — F319 Bipolar disorder, unspecified: Secondary | ICD-10-CM | POA: Diagnosis not present

## 2016-03-20 DIAGNOSIS — R000 Tachycardia, unspecified: Secondary | ICD-10-CM | POA: Diagnosis present

## 2016-03-20 DIAGNOSIS — S81812A Laceration without foreign body, left lower leg, initial encounter: Secondary | ICD-10-CM | POA: Insufficient documentation

## 2016-03-20 DIAGNOSIS — E876 Hypokalemia: Secondary | ICD-10-CM | POA: Diagnosis not present

## 2016-03-20 DIAGNOSIS — F329 Major depressive disorder, single episode, unspecified: Secondary | ICD-10-CM | POA: Diagnosis present

## 2016-03-20 DIAGNOSIS — L03115 Cellulitis of right lower limb: Secondary | ICD-10-CM | POA: Insufficient documentation

## 2016-03-20 DIAGNOSIS — L03119 Cellulitis of unspecified part of limb: Secondary | ICD-10-CM | POA: Diagnosis not present

## 2016-03-20 DIAGNOSIS — I9589 Other hypotension: Secondary | ICD-10-CM

## 2016-03-20 DIAGNOSIS — L0390 Cellulitis, unspecified: Secondary | ICD-10-CM | POA: Diagnosis present

## 2016-03-20 DIAGNOSIS — I959 Hypotension, unspecified: Secondary | ICD-10-CM | POA: Diagnosis present

## 2016-03-20 DIAGNOSIS — S81812S Laceration without foreign body, left lower leg, sequela: Secondary | ICD-10-CM

## 2016-03-20 DIAGNOSIS — F32A Depression, unspecified: Secondary | ICD-10-CM | POA: Diagnosis present

## 2016-03-20 DIAGNOSIS — L03116 Cellulitis of left lower limb: Secondary | ICD-10-CM

## 2016-03-20 LAB — COMPREHENSIVE METABOLIC PANEL
ALBUMIN: 3 g/dL — AB (ref 3.5–5.0)
ALT: 8 U/L — ABNORMAL LOW (ref 14–54)
ANION GAP: 7 (ref 5–15)
AST: 13 U/L — AB (ref 15–41)
Alkaline Phosphatase: 44 U/L (ref 38–126)
BUN: 6 mg/dL (ref 6–20)
CHLORIDE: 105 mmol/L (ref 101–111)
CO2: 25 mmol/L (ref 22–32)
Calcium: 8.6 mg/dL — ABNORMAL LOW (ref 8.9–10.3)
Creatinine, Ser: 0.63 mg/dL (ref 0.44–1.00)
GFR calc Af Amer: >60 mL/min (ref >60)
GFR calc non Af Amer: >60 mL/min (ref >60)
GLUCOSE: 108 mg/dL — AB (ref 65–99)
POTASSIUM: 3.2 mmol/L — AB (ref 3.5–5.1)
SODIUM: 137 mmol/L (ref 135–145)
Total Bilirubin: 0.4 mg/dL (ref 0.3–1.2)
Total Protein: 6.1 g/dL — ABNORMAL LOW (ref 6.5–8.1)

## 2016-03-20 LAB — RAPID HIV SCREEN (HIV 1/2 AB+AG)
HIV 1/2 Antibodies: NONREACTIVE
HIV-1 P24 Antigen - HIV24: NONREACTIVE

## 2016-03-20 LAB — CBC WITH DIFFERENTIAL/PLATELET
BASOS ABS: 0 10*3/uL (ref 0.0–0.1)
Basophils Relative: 0 %
EOS PCT: 1 %
Eosinophils Absolute: 0 10*3/uL (ref 0.0–0.7)
HEMATOCRIT: 32.2 % — AB (ref 36.0–46.0)
Hemoglobin: 10.6 g/dL — ABNORMAL LOW (ref 12.0–15.0)
LYMPHS ABS: 1.8 10*3/uL (ref 0.7–4.0)
LYMPHS PCT: 21 %
MCH: 28.4 pg (ref 26.0–34.0)
MCHC: 32.9 g/dL (ref 30.0–36.0)
MCV: 86.3 fL (ref 78.0–100.0)
MONO ABS: 0.7 10*3/uL (ref 0.1–1.0)
Monocytes Relative: 8 %
NEUTROS ABS: 5.9 10*3/uL (ref 1.7–7.7)
Neutrophils Relative %: 70 %
PLATELETS: 268 10*3/uL (ref 150–400)
RBC: 3.73 MIL/uL — AB (ref 3.87–5.11)
RDW: 13.1 % (ref 11.5–15.5)
WBC: 8.4 10*3/uL (ref 4.0–10.5)

## 2016-03-20 LAB — URINALYSIS, ROUTINE W REFLEX MICROSCOPIC
Bilirubin Urine: NEGATIVE
GLUCOSE, UA: NEGATIVE mg/dL
HGB URINE DIPSTICK: NEGATIVE
Ketones, ur: NEGATIVE mg/dL
Nitrite: NEGATIVE
PH: 8 (ref 5.0–8.0)
PROTEIN: NEGATIVE mg/dL
Specific Gravity, Urine: 1.01 (ref 1.005–1.030)

## 2016-03-20 LAB — URINE MICROSCOPIC-ADD ON

## 2016-03-20 LAB — I-STAT CG4 LACTIC ACID, ED
LACTIC ACID, VENOUS: 1.19 mmol/L (ref 0.5–1.9)
LACTIC ACID, VENOUS: 0.3 mmol/L — AB (ref 0.5–1.9)

## 2016-03-20 MED ORDER — DICLOFENAC SODIUM 75 MG PO TBEC: 75.0000 mg | DELAYED_RELEASE_TABLET | Freq: Two times a day (BID) | ORAL | 0 refills | Status: AC

## 2016-03-20 MED ORDER — AMOXICILLIN-POT CLAVULANATE 875-125 MG PO TABS: 1.0000 | ORAL_TABLET | Freq: Two times a day (BID) | ORAL | 0 refills | Status: AC

## 2016-03-20 MED ORDER — SODIUM CHLORIDE 0.9 % IV SOLN
INTRAVENOUS | Status: DC
  Administered 2016-03-20: 05:00:00 via INTRAVENOUS

## 2016-03-20 MED ORDER — DICLOFENAC SODIUM 75 MG PO TBEC: 75.0000 mg | DELAYED_RELEASE_TABLET | Freq: Two times a day (BID) | ORAL | 0 refills | Status: DC

## 2016-03-20 MED ORDER — KETOROLAC TROMETHAMINE 30 MG/ML IJ SOLN
30.0000 mg | Freq: Once | INTRAMUSCULAR | Status: AC
  Administered 2016-03-20: 30 mg via INTRAVENOUS
  Filled 2016-03-20: qty 1

## 2016-03-20 MED ORDER — SODIUM CHLORIDE 0.9 % IV BOLUS (SEPSIS)
500.0000 mL | Freq: Once | INTRAVENOUS | Status: AC
  Administered 2016-03-20: 500 mL via INTRAVENOUS

## 2016-03-20 MED ORDER — POTASSIUM CHLORIDE 20 MEQ/15ML (10%) PO SOLN
40.0000 meq | Freq: Once | ORAL | Status: AC
Start: 2016-03-20 — End: 2016-03-20
  Administered 2016-03-20: 40 meq via ORAL
  Filled 2016-03-20: qty 30

## 2016-03-20 MED ORDER — AMOXICILLIN-POT CLAVULANATE 875-125 MG PO TABS: 1.0000 | ORAL_TABLET | Freq: Two times a day (BID) | ORAL | 0 refills | Status: DC

## 2016-03-20 MED ORDER — FLUCONAZOLE 150 MG PO TABS: 150.0000 mg | ORAL_TABLET | Freq: Every day | ORAL | 0 refills | Status: AC

## 2016-03-20 MED ORDER — DOXYCYCLINE HYCLATE 100 MG PO TABS: 100.0000 mg | ORAL_TABLET | Freq: Two times a day (BID) | ORAL | 0 refills | Status: DC

## 2016-03-20 MED ORDER — CEFAZOLIN IN D5W 1 GM/50ML IV SOLN
1.0000 g | Freq: Once | INTRAVENOUS | Status: AC
  Administered 2016-03-20: 1 g via INTRAVENOUS
  Filled 2016-03-20: qty 50

## 2016-03-20 MED ORDER — FLUCONAZOLE 150 MG PO TABS: 150.0000 mg | ORAL_TABLET | Freq: Every day | ORAL | 0 refills | Status: DC

## 2016-03-20 MED ORDER — DOXYCYCLINE HYCLATE 100 MG PO TABS: 100.0000 mg | ORAL_TABLET | Freq: Two times a day (BID) | ORAL | 0 refills | Status: AC

## 2016-03-20 MED ORDER — SODIUM CHLORIDE 0.9 % IV BOLUS (SEPSIS)
1000.0000 mL | Freq: Once | INTRAVENOUS | Status: AC
  Administered 2016-03-20: 1000 mL via INTRAVENOUS

## 2016-03-20 NOTE — Discharge Instructions (Signed)
You have developed a bacterial infection from your recent fall.  Please take the antibiotics for the full 10 days as prescribed.  Please use the Voltaren for pain Please use the diflucan if you develop a yeast infection Please come back to the hospital if you get worse.

## 2016-03-20 NOTE — ED Notes (Signed)
Nurse starting IV 

## 2016-03-20 NOTE — ED Notes (Signed)
Dr. Konrad DoloresMerrell at bedside discussing discharge plan with patient. No questions. Prescriptions reviewed.

## 2016-03-20 NOTE — ED Notes (Signed)
[facility] placement.

## 2016-03-20 NOTE — ED Notes (Signed)
Nurse drawing labs. 

## 2016-03-20 NOTE — ED Notes (Signed)
Patient placed back on continuous pulse oximetry and blood pressure cuff; patient talking on her cell phone; no needs at this time

## 2016-03-20 NOTE — ED Provider Notes (Signed)
[facility]   CSN: [id number] Arrival date & time: 03/19/16  2104     History   Chief Complaint Chief Complaint  Patient presents with  . Leg Pain    HPI Victoria Robbins is a 25 y.o. female with a hx of anxiety presents to the Emergency Department complaining of gradual, persistent, progressively worsening bilateral knee pain onset 03/06/16.  She reports she fell into a hole and her knees were bruised.  Small abrasion without open wound.  No bleeding.  Pt reports everything was fine until yesterday morning.  She reports she got sick with ssx of ST, post nasal drip. She had a fever of 103-104 at home the 3 days prior [facility] and treated with penicillin.  Symptoms improved until yesterday.  No fevers since that time.  She continues to take her PCN.  She then developed pain in her knees, joint stiffness and anterior erythema over the patella.  She reports decreased ROM due to pain and stiffness.  Pt reports the sites of redness are in the same space as the trauma inflicted from the fall.    The history is provided by the patient and medical records. No language interpreter was used.    Past Medical History:  Diagnosis Date  . Anxiety and depression   . Bipolar 1 disorder (HCC)   . Suicide attempt by multiple drug overdose (HCC)    03/12/2014 -was trying to OD on motrin and xanax    Patient Active Problem List   Diagnosis Date Noted  . Cellulitis 03/20/2016  . Loss of consciousness (HCC) 03/17/2014  . Depression 03/16/2014  . MDD (major depressive disorder), severe (HCC) 03/12/2014  . Suicide attempt 03/12/2014    Past Surgical History:  Procedure Laterality Date  . NO PAST SURGERIES      OB History    No data available       Home Medications    Prior to Admission medications   Medication Sig Start Date End Date Taking? Authorizing Provider  levonorgestrel-ethinyl estradiol (AVIANE,ALESSE,LESSINA) 0.1-20  MG-MCG tablet Take 1 tablet by mouth daily.   Yes Historical Provider, MD  penicillin v potassium (VEETID) 500 MG tablet Take 500 mg by mouth 4 (four) times daily.   Yes Historical Provider, MD  doxycycline (VIBRAMYCIN) 100 MG capsule Take 1 capsule (100 mg total) by mouth 2 (two) times daily. Patient not taking: Reported on 03/20/2016 11/09/15   Tilden FossaElizabeth Rees, MD  metroNIDAZOLE (FLAGYL) 500 MG tablet Take 1 tablet (500 mg total) by mouth 2 (two) times daily. Patient not taking: Reported on 03/20/2016 11/09/15   Tilden FossaElizabeth Rees, MD    Family History Family History  Problem Relation Age of Onset  . Seizures Mother   . Seizures Sister   . Seizures Maternal Grandmother     Social History Social History  Substance Use Topics  . Smoking status: Never Smoker  . Smokeless tobacco: Never Used  . Alcohol use Yes     Comment: occ     Allergies   Other; Buspar [buspirone]; and Lamictal [lamotrigine]   Review of Systems Review of Systems  Constitutional: Positive for fever.  HENT: Positive for sore throat.   Musculoskeletal: Positive for arthralgias.  Skin: Positive for color change and wound.  All other systems reviewed and are negative.    Physical Exam Updated Vital Signs BP 114/78 (BP Location: Left Arm)   Pulse 113   Temp 98.5 F (36.9 C) (Oral)  Resp 16   Ht 5\' 1"  (1.549 m)   Wt 49.9 kg   LMP 02/03/2016 (Exact Date)   SpO2 100%   BMI 20.78 kg/m   Physical Exam  Constitutional: She appears well-developed and well-nourished. No distress.  Awake, alert, nontoxic appearance  HENT:  Head: Normocephalic and atraumatic.  Mouth/Throat: Oropharynx is clear and moist. No oropharyngeal exudate.  Eyes: Conjunctivae are normal. No scleral icterus.  Neck: Normal range of motion. Neck supple.  Cardiovascular: Normal rate, regular rhythm and intact distal pulses.   Capillary refill < 3 sec  Pulmonary/Chest: Effort normal and breath sounds normal. No respiratory distress. She  has no wheezes.  Equal chest expansion  Abdominal: Soft. Bowel sounds are normal. She exhibits no mass. There is no tenderness. There is no rebound and no guarding.  Musculoskeletal: Normal range of motion. She exhibits tenderness. She exhibits no edema.  Tenderness to palpation of the anterior bilateral knees with erythema and increased warmth Almost full range of motion of both knees, no circumferential erythema  Neurological: She is alert. Coordination normal.  Sensation intact to normal touch in the bilateral lower extremities Strength 5/5 with flexion and extension of the hips, knees and ankles  Skin: Skin is warm and dry. She is not diaphoretic.  No tenting of the skin well-circumscribed areas of erythema of the anterior aspects of both knees along with other lesions to the anterior portion of the bilateral lower legs and thighs  Psychiatric: She has a normal mood and affect.  Nursing note and vitals reviewed.    ED Treatments / Results  Labs (all labs ordered are listed, but only abnormal results are displayed) Labs Reviewed  COMPREHENSIVE METABOLIC PANEL - Abnormal; Notable for the following:       Result Value   Potassium 3.2 (*)    Glucose, Bld 108 (*)    Calcium 8.6 (*)    Total Protein 6.1 (*)    Albumin 3.0 (*)    AST 13 (*)    ALT 8 (*)    All other components within normal limits  CBC WITH DIFFERENTIAL/PLATELET - Abnormal; Notable for the following:    RBC 3.73 (*)    Hemoglobin 10.6 (*)    HCT 32.2 (*)    All other components within normal limits  URINALYSIS, ROUTINE W REFLEX MICROSCOPIC (NOT AT [facility]) - Abnormal; Notable for the following:    APPearance TURBID (*)    Leukocytes, UA SMALL (*)    All other components within normal limits  URINE MICROSCOPIC-ADD ON - Abnormal; Notable for the following:    Squamous Epithelial / LPF 6-30 (*)    Bacteria, UA FEW (*)    All other components within normal limits  CULTURE, BLOOD (ROUTINE X 2)  CULTURE, BLOOD  (ROUTINE X 2)  URINE CULTURE  RAPID HIV SCREEN (HIV 1/2 AB+AG)  I-STAT CG4 LACTIC ACID, ED  I-STAT CG4 LACTIC ACID, ED     Procedures Procedures (including critical care time)  Medications Ordered in ED Medications  0.9 %  sodium chloride infusion (not administered)  potassium chloride 20 MEQ/15ML (10%) solution 40 mEq (not administered)  sodium chloride 0.9 % bolus 1,000 mL (0 mLs Intravenous Stopped 03/20/16 0305)    And  sodium chloride 0.9 % bolus 500 mL (0 mLs Intravenous Stopped 03/20/16 0421)  ketorolac (TORADOL) 30 MG/ML injection 30 mg (30 mg Intravenous Given 03/20/16 0356)  ceFAZolin (ANCEF) IVPB 1 g/50 mL premix (1 g Intravenous New Bag/Given 03/20/16 0406)  sodium  chloride 0.9 % bolus 500 mL (500 mLs Intravenous New Bag/Given 03/20/16 0406)     Initial Impression / Assessment and Plan / ED Course  I have reviewed the triage vital signs and the nursing notes.  Pertinent labs & imaging results that were available during my care of the patient were reviewed by me and considered in my medical decision making (see chart for details).  Clinical Course    Patient with cellulitis. No evidence of septic joint. Patient presents tachycardic. She reports fevers at home. No fever here. 30 mL/kg bolus given without improvement in tachycardia. Blood pressures are soft with systolics in the 90s. Patient reports this is normal for her. We'll admit for IV antibiotics, fluid hydration and further monitoring.  Final Clinical Impressions(s) / ED Diagnoses   Final diagnoses:  Cellulitis of lower extremity, unspecified laterality  Tachycardia    New Prescriptions New Prescriptions   No medications on file     Dierdre Forth, PA-C 03/20/16 0502    Dione Booze, MD 03/20/16 [phone]

## 2016-03-20 NOTE — Consult Note (Signed)
Medical Consultation   Victoria Robbins  ZOX:[id number]  DOB: 1990/11/21  DOA: 03/20/2016  PCP: No PCP Per Patient   Outpatient Specialists:    Requesting physician: Dr Clarene [facility]   Reason for consultation: Admission-- recommend discharge   History of Present Illness: Victoria Robbins is an 25 y.o. female with medical history significant for Biporlar 1 disorder, anxiety and depression, major depressive disorder with suicide attempt 2015 presents to the emergency Department chief complaint of bilateral knee pain. Initial evaluation concerning for mild cellulitis and reveals a tachycardia and hypotension.  Information is obtained from the patient. She reports October night she fell through a loose manhole cover and her both of her knees and a small "gash" on her lower left leg. At that time she did not seek treatment but reports that several days later she developed a fever of 102 associated with general body aches. She denies any headache chills nausea vomiting diarrhea. She states [facility] and reports she was given penicillin for strep but indicates not have a positive test and cultures were not sent. Since that time she states she's been working without leg pain. Yesterday she developed erythema and swelling on the right knee and left lower leg as well as bilateral leg pain. She reports sent had a fever of 104. Denies chest pain palpitation shortness of breath dysuria hematuria frequency or urgency. She denies diarrhea constipation melena bright red blood per rectum.  Review of Systems:  ROS As per HPI otherwise 10 point review of systems negative.   Past Medical History: Past Medical History:  Diagnosis Date  . Anxiety and depression   . Bipolar 1 disorder (HCC)   . Suicide attempt by multiple drug overdose (HCC)    03/12/2014 -was trying to OD on motrin and xanax    Past Surgical History: Past Surgical History:  Procedure  Laterality Date  . NO PAST SURGERIES       Allergies:buspar   Allergies  Allergen Reactions  . Other Rash    A Depression medication, pt not sure of name, Pt is vegan  . Buspar [Buspirone] Other (See Comments)    Felt like a "zombie"  . Lamictal [Lamotrigine] Hives     Social History:  reports that she has never smoked. She has never used smokeless tobacco. She reports that she drinks alcohol. She reports that she uses drugs, including Marijuana. She lives in an apartment with roommates. She is employed at [employer] and [employer]  Family History: Family History  Problem Relation Age of Onset  . Seizures Mother   . Seizures Sister   . Seizures Maternal Grandmother     Unacceptable:mother and father still alive both with seizure disorder. She has 2 siblings and unsure of their health history   Physical Exam: Vitals:   03/20/16 0745 03/20/16 0800 03/20/16 0815 03/20/16 0821  BP: 100/68 107/76 104/73 104/73  Pulse: 97 105 100 101  Resp:    16  Temp:      TempSrc:      SpO2: 97% 100% 100% 99%  Weight:      Height:        Constitutional:   Alert and awake, oriented x3, not in any acute distress. Eyes: PERLA, EOMI, irises appear normal, anicteric sclera,  ENMT: external ears and nose appear normal,  Lips appears normal, oropharynx mucosa, tongue, posterior pharynx appear normal  Neck:  neck appears normal, no masses, normal ROM, no thyromegaly, no JVD  CVS: tachycardia S1-S2 clear, no murmur rubs or gallops, no LE edema, normal pedal pulses  Respiratory:  clear to auscultation bilaterally, no wheezing, rales or rhonchi. Respiratory effort normal. No accessory muscle use.  Abdomen: soft nontender, nondistended, normal bowel sounds, no hepatosplenomegaly, no hernias  Musculoskeletal: : no cyanosis, right knee with erythema and warmth mild tenderness full range of motion left lower leg healing laceration with mild erythema no warmth mild tenderness Neuro: Cranial nerves  II-XII intact, strength, sensation, reflexes Psych: judgement and insight appear normal, stable mood and flat affect, mental status Skin: no rashes or lesions or ulcers, no induration or nodules    Data reviewed:  I have personally reviewed following labs and imaging studies Labs:  CBC:  Recent Labs Lab 03/20/16 0155  WBC 8.4  NEUTROABS 5.9  HGB 10.6*  HCT 32.2*  MCV 86.3  PLT 268    Basic Metabolic Panel:  Recent Labs Lab 03/20/16 0155  NA 137  K 3.2*  CL 105  CO2 25  GLUCOSE 108*  BUN 6  CREATININE 0.63  CALCIUM 8.6*   GFR Estimated Creatinine Clearance: 81.1 mL/min (by C-G formula based on SCr of 0.63 mg/dL). Liver Function Tests:  Recent Labs Lab 03/20/16 0155  AST 13*  ALT 8*  ALKPHOS 44  BILITOT 0.4  PROT 6.1*  ALBUMIN 3.0*   No results for input(s): LIPASE, AMYLASE in the last 168 hours. No results for input(s): AMMONIA in the last 168 hours. Coagulation profile No results for input(s): INR, PROTIME in the last 168 hours.  Cardiac Enzymes: No results for input(s): CKTOTAL, CKMB, CKMBINDEX, TROPONINI in the last 168 hours. BNP: Invalid input(s): POCBNP CBG: No results for input(s): GLUCAP in the last 168 hours. D-Dimer No results for input(s): DDIMER in the last 72 hours. Hgb A1c No results for input(s): HGBA1C in the last 72 hours. Lipid Profile No results for input(s): CHOL, HDL, LDLCALC, TRIG, CHOLHDL, LDLDIRECT in the last 72 hours. Thyroid function studies No results for input(s): TSH, T4TOTAL, T3FREE, THYROIDAB in the last 72 hours.  Invalid input(s): FREET3 Anemia work up No results for input(s): VITAMINB12, FOLATE, FERRITIN, TIBC, IRON, RETICCTPCT in the last 72 hours. Urinalysis    Component Value Date/Time   COLORURINE YELLOW 03/20/2016 0327   APPEARANCEUR TURBID (A) 03/20/2016 0327   LABSPEC 1.010 03/20/2016 0327   PHURINE 8.0 03/20/2016 0327   GLUCOSEU NEGATIVE 03/20/2016 0327   HGBUR NEGATIVE 03/20/2016 0327    BILIRUBINUR NEGATIVE 03/20/2016 0327   KETONESUR NEGATIVE 03/20/2016 0327   PROTEINUR NEGATIVE 03/20/2016 0327   UROBILINOGEN 0.2 03/09/2014 2052   NITRITE NEGATIVE 03/20/2016 0327   LEUKOCYTESUR SMALL (A) 03/20/2016 0327     Microbiology No results found for this or any previous visit (from the past 240 hour(s)).     Inpatient Medications:   Scheduled Meds:  Continuous Infusions: . sodium chloride 125 mL/hr at 03/20/16 0522     Radiological Exams on Admission: No results found.  Impression/Recommendations Principal Problem:   Cellulitis Active Problems:   Depression   Hypotension   Tachycardia   Hypokalemia  #1. Cellulitis of the right knee. Mild. Likely related to recent injury. No leukocytosis max temperature 99.5 lactic acid within the limits of normal mild tachycardia improving with fluids. He is provided with 2 L of fluid in the emergency department as well as Ancef and Toradol. At the time of my exam heart rate is 101 systolic  blood pressure 100s. She is afebrile nontoxic appearing. Urinalysis unremarkable. Chart review indicates her baseline heart rate is the high end of normal. She ambulated to the bathroom in the emergency department without a problem -Recommend discharge with analgesia and antibiotics -Stat EKG for completeness -Instructed to to follow-up if symptoms fail to improve or worsen  #2. Hypotension. Likely related to dehydration. Systolic blood pressure 84 on presentation. Divided with fluid resuscitation and at time of discharge systolic blood pressure 104. -Able to maintain hydration orally  #3. Tachycardia. Upon presentation rate 122. Denies chest pain palpitations. Chart review indicates her baseline heart rate is at the high end of normal. At discharge heart rate 101. -EKG for completeness  #4. Depression/anxiety/bipolar. Chart review indicates 2 years ago and October patient admitted for major depressive disorder and suicide attempt.  Discharge summary does not indicates she was discharged on any home medications. She appears stable at baseline. Signs symptoms of depression/anxiety. No suicidal ideation  #5. Hypokalemia. Mild. Potassium 3.2. Likely related to decreased oral intake. She was provided with 40 mEq of potassium orally -Outpatient follow-up   Thank you for this consultation.     Time Spent: 45 minutes  Gwenyth Bender M.D. [facility] 03/20/2016, 9:43 AM

## 2016-03-20 NOTE — ED Notes (Signed)
Pt ambulated to restroom without distress.  

## 2016-03-20 NOTE — Progress Notes (Signed)
This is a no charge note   Pending admission per PA, Dahlia ClientHannah  25 year old lady with past medical history of drug overdose, bipolar, depression, anxiety, suicide attempts, who presents with bilateral leg cellulitis and sepsis. Blood pressure 85/52 which improved to 93/55 after 2 L normal saline bolus, WBC 8.4, lactate normal, urinalysis with small moderate leukocytes, potassium is 3.2 which is repleted, temperature 99.5, tachycardia, tachypnea. Ancef was given in ED. Pt is accepted to SDU as inpt. Will continue NS at 125 cc/h   Lorretta HarpXilin Rosali Augello, MD  [facility] Pager [phone]  If 7PM-7AM, please contact night-coverage [email or web address] Password [email or web address] 03/20/2016, 5:06 AM

## 2016-03-20 NOTE — ED Notes (Signed)
Spoke with Cyndia SkeetersHannah M. PA regarding patients current POC ref: code sepsis activation without orders for abx administration. At this time citing patient's apparent stability and assorted possible causes for symptoms and scarce  objective findings provider advises that code sepsis may be discontinued.

## 2016-03-20 NOTE — ED Notes (Signed)
RN went to discharge patient; right elbow joint now hurting and mildly swollen; tender to palpation and with movement. Dr. Clarene DukeLittle notified and at bedside to assess.

## 2016-03-20 NOTE — ED Notes (Signed)
2 unsuccessful IV attempts.

## 2016-03-21 LAB — URINE CULTURE

## 2016-03-25 LAB — CULTURE, BLOOD (ROUTINE X 2)
CULTURE: NO GROWTH
Culture: NO GROWTH

## 2016-07-04 ENCOUNTER — Other Ambulatory Visit: Payer: Self-pay | Admitting: Obstetrics & Gynecology

## 2017-10-13 IMAGING — CT CT RENAL STONE PROTOCOL
2 of 4 series · 16 of 46 positions shown, 18 images · non-contrast
Comparison: None.

CLINICAL DATA: Right-sided flank pain for 1 day.  Nausea.

EXAM:
CT ABDOMEN AND PELVIS WITHOUT CONTRAST
TECHNIQUE: Multidetector CT imaging of the abdomen and pelvis was performed
following the standard protocol without IV contrast.

[Series 2: stone study 5.0 i30f 1 · axial · 0.50mm/px · z∈[-924,-594]mm · 13 of 75 slices shown, 15 images]
[im 6/75  soft-tissue]
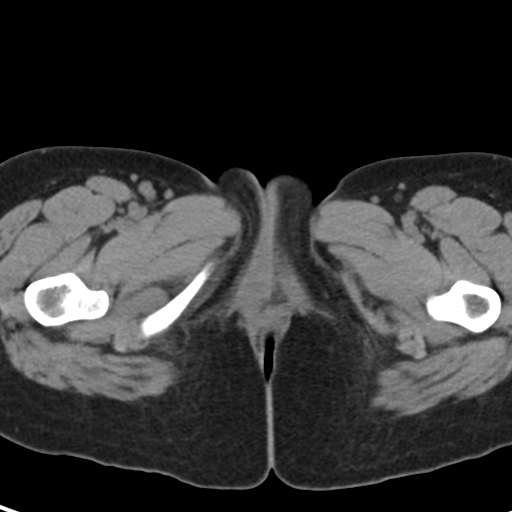
[im 6/75  bone]
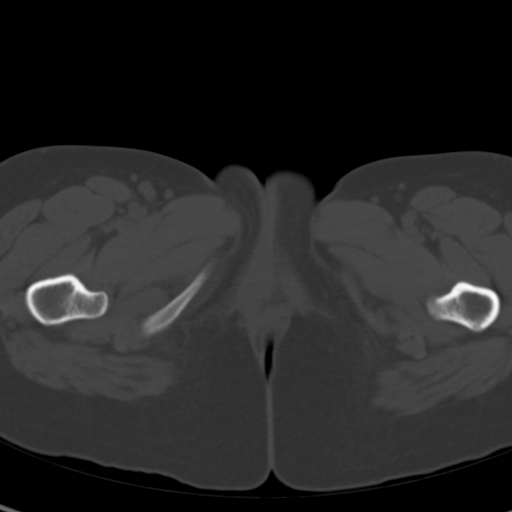
[im 11/75  soft-tissue]
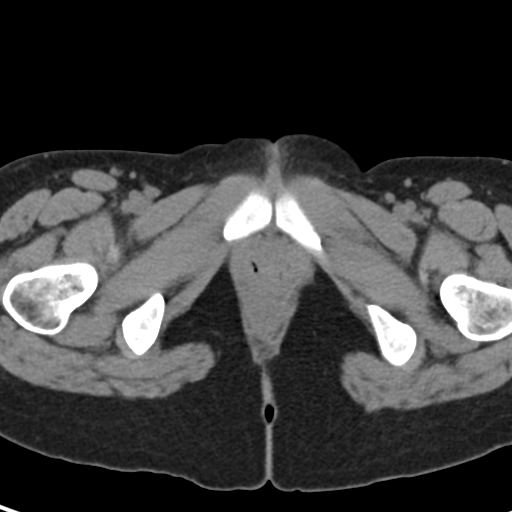
[im 17/75  soft-tissue]
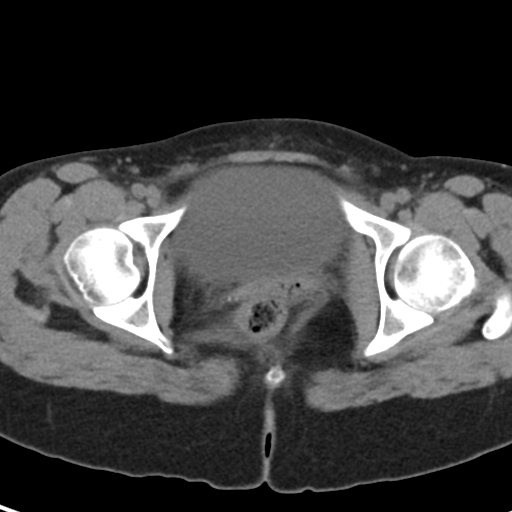
[im 22/75  soft-tissue]
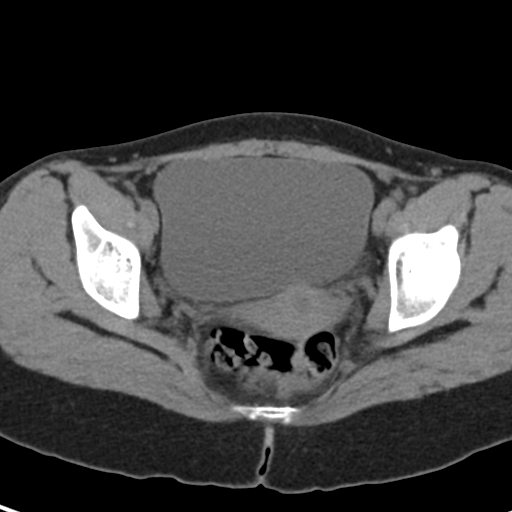
[im 28/75  soft-tissue]
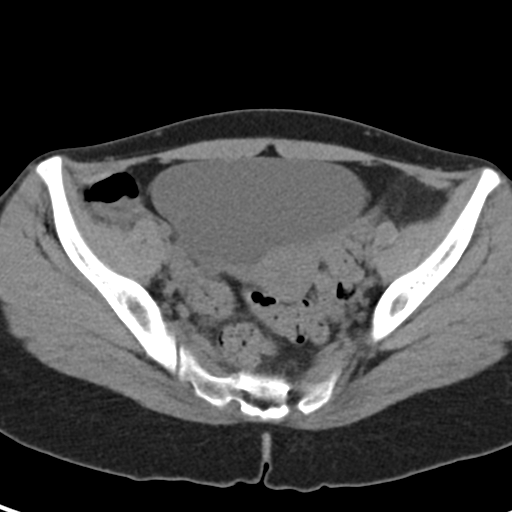
[im 33/75  soft-tissue]
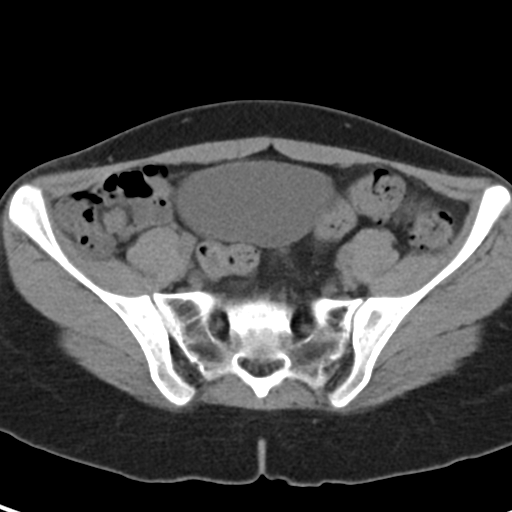
[im 39/75  soft-tissue]
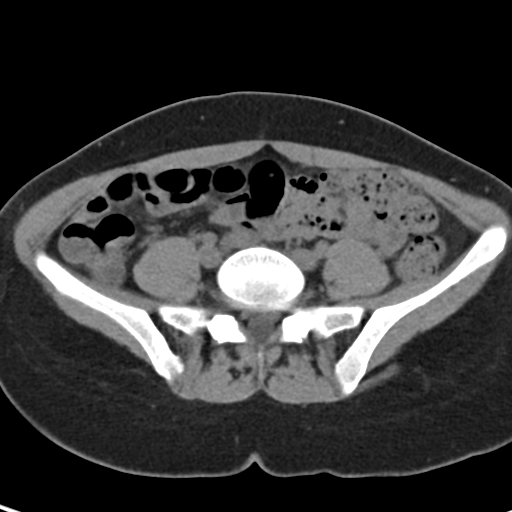
[im 44/75  soft-tissue]
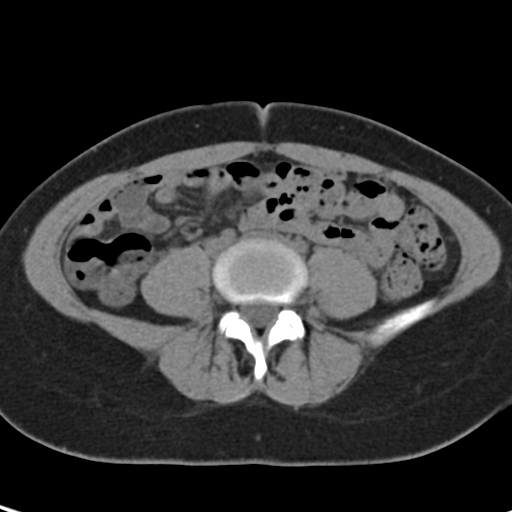
[im 50/75  soft-tissue]
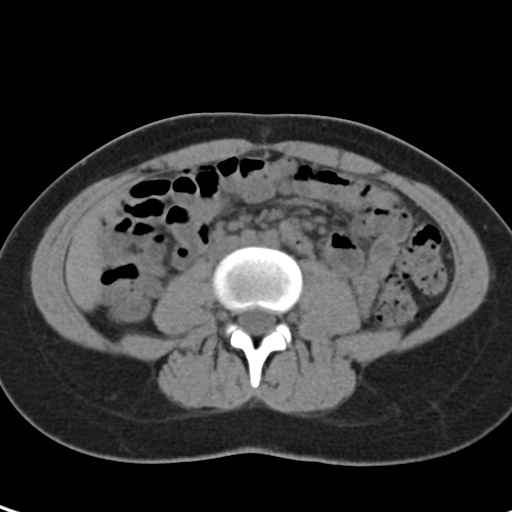
[im 50/75  bone]
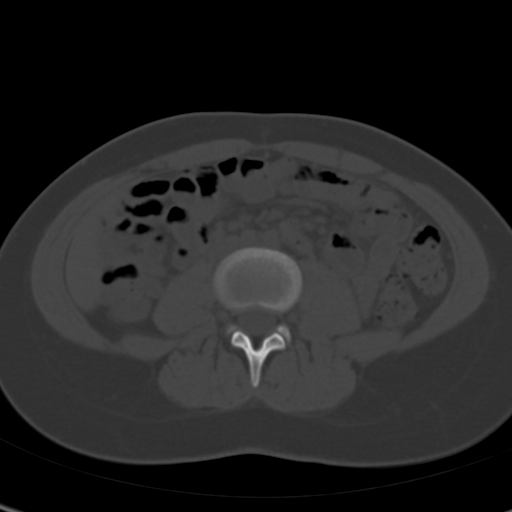
[im 55/75  soft-tissue]
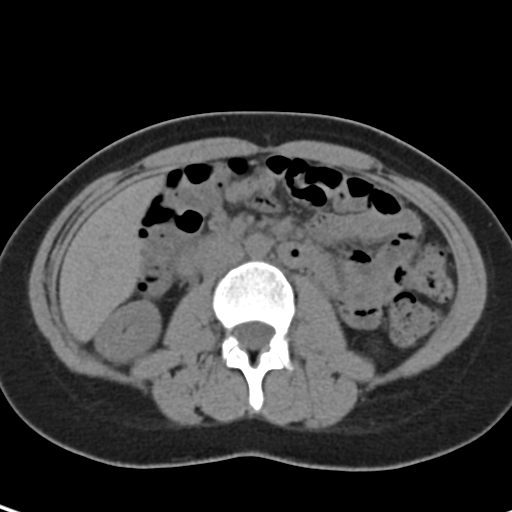
[im 61/75  soft-tissue]
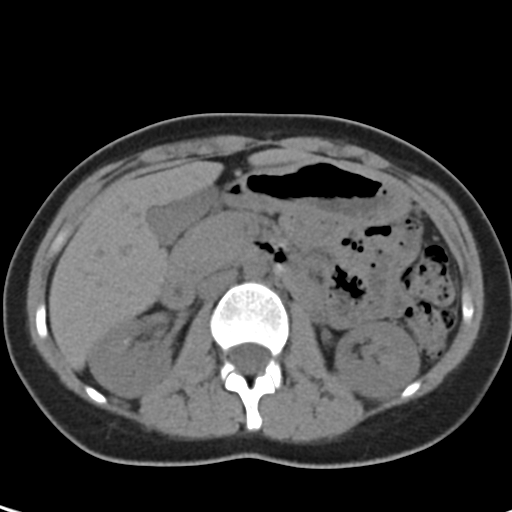
[im 66/75  soft-tissue]
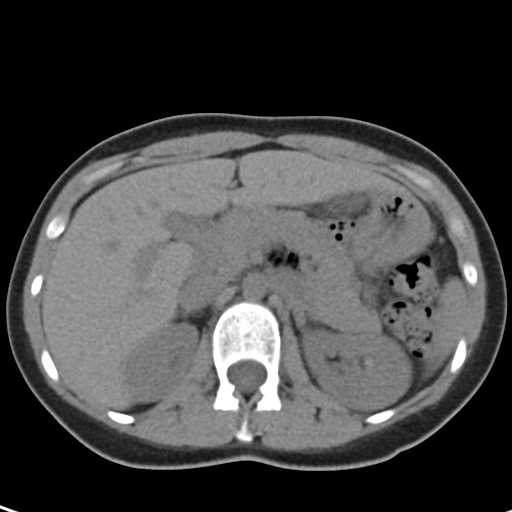
[im 72/75  soft-tissue]
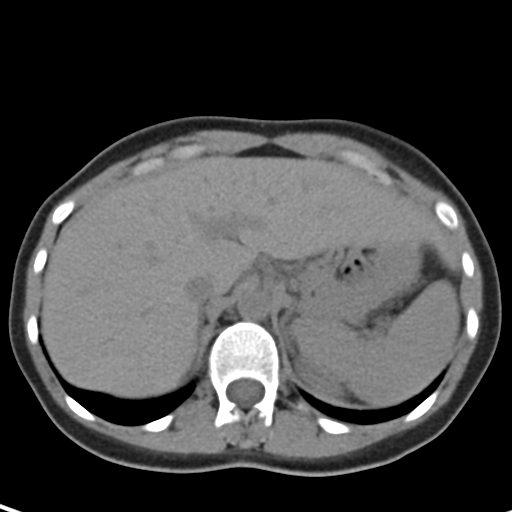

[Series 5: coronal soft tissue · coronal · 0.55mm/px · 3 of 56 slices shown]
[im 19/56  soft-tissue]
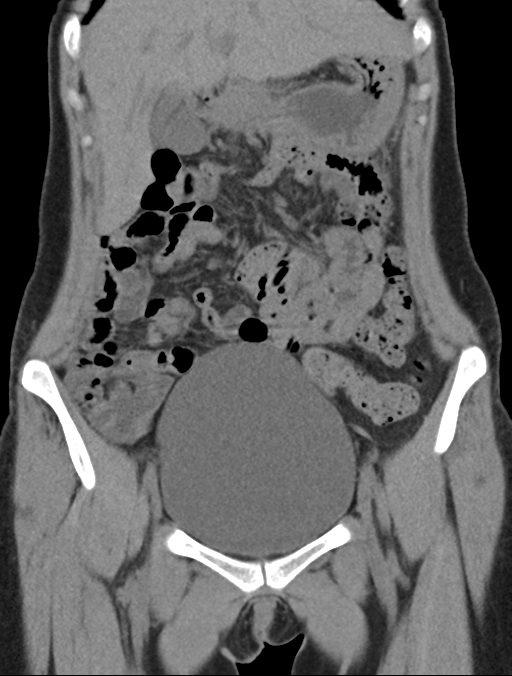
[im 25/56  soft-tissue]
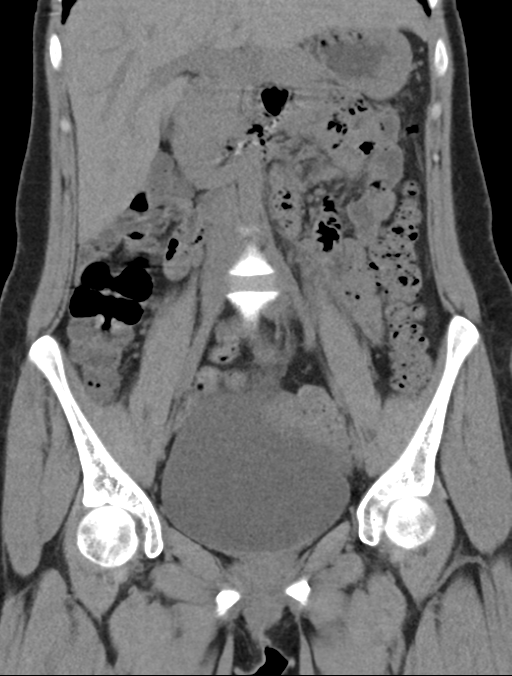
[im 31/56  soft-tissue]
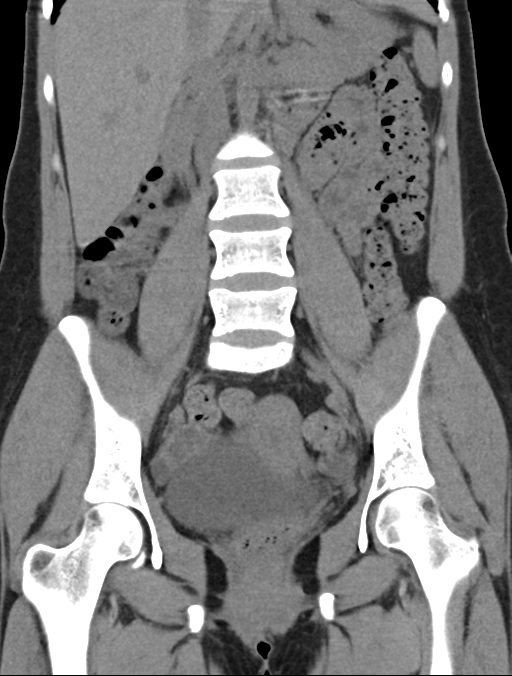

[16 of 46 positions shown; findings below may reference images not displayed]

FINDINGS: The kidneys are symmetrical in size and shape. No hydronephrosis or
hydroureter. No renal, ureteral, or bladder stones. The bladder is
somewhat distended with urine but no wall thickening or filling
defects are demonstrated.

The unenhanced appearance of the liver, spleen, pancreas,
gallbladder, adrenal glands, abdominal aorta, inferior vena cava,
and retroperitoneal lymph nodes is unremarkable. Stomach, small
bowel, and colon are mostly decompressed. Scattered stool throughout
the colon. Scattered fluid in the small bowel. No free air or free
fluid in the abdomen.

Pelvis: Appendix is normal. Uterus and ovaries are not enlarged. No
free or loculated pelvic fluid collections. No pelvic mass or
lymphadenopathy. No destructive bone lesions.
IMPRESSION: No renal or ureteral stone or obstruction. Nonspecific distention of
the bladder, possibly physiologic or due to a bladder outlet
obstruction.
